# Patient Record
Sex: Female | Born: 1980 | Race: White | Hispanic: No | Smoking: Former smoker
Health system: Southern US, Community
[De-identification: ages and names within clinical notes are randomized; demographics above are authoritative.]

## PROBLEM LIST (undated history)

## (undated) ENCOUNTER — Emergency Department: Admission: EM | Payer: Commercial Managed Care - PPO | Source: Home / Self Care

## (undated) DIAGNOSIS — M1990 Unspecified osteoarthritis, unspecified site: Secondary | ICD-10-CM

## (undated) DIAGNOSIS — E079 Disorder of thyroid, unspecified: Secondary | ICD-10-CM

## (undated) DIAGNOSIS — E119 Type 2 diabetes mellitus without complications: Secondary | ICD-10-CM

## (undated) DIAGNOSIS — R51 Headache: Secondary | ICD-10-CM

## (undated) DIAGNOSIS — Q211 Atrial septal defect: Secondary | ICD-10-CM

## (undated) DIAGNOSIS — D649 Anemia, unspecified: Secondary | ICD-10-CM

## (undated) DIAGNOSIS — C801 Malignant (primary) neoplasm, unspecified: Secondary | ICD-10-CM

## (undated) DIAGNOSIS — K219 Gastro-esophageal reflux disease without esophagitis: Secondary | ICD-10-CM

## (undated) DIAGNOSIS — Q2112 Patent foramen ovale: Secondary | ICD-10-CM

## (undated) DIAGNOSIS — F419 Anxiety disorder, unspecified: Secondary | ICD-10-CM

## (undated) DIAGNOSIS — J45909 Unspecified asthma, uncomplicated: Secondary | ICD-10-CM

## (undated) DIAGNOSIS — I739 Peripheral vascular disease, unspecified: Secondary | ICD-10-CM

## (undated) DIAGNOSIS — R519 Headache, unspecified: Secondary | ICD-10-CM

## (undated) HISTORY — PX: KNEE SURGERY: SHX244

## (undated) HISTORY — PX: CHOLECYSTECTOMY: SHX55

## (undated) HISTORY — PX: SEPTOPLASTY: SUR1290

## (undated) HISTORY — PX: ABDOMINAL HYSTERECTOMY: SHX81

---
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
## 2005-07-09 ENCOUNTER — Emergency Department: Payer: Self-pay | Admitting: General Practice

## 2005-09-30 ENCOUNTER — Emergency Department: Payer: Self-pay | Admitting: Emergency Medicine

## 2006-03-28 ENCOUNTER — Emergency Department: Payer: Self-pay | Admitting: Emergency Medicine

## 2008-08-02 ENCOUNTER — Emergency Department: Payer: Self-pay | Admitting: Unknown Physician Specialty

## 2008-08-22 ENCOUNTER — Ambulatory Visit: Payer: Self-pay | Admitting: Obstetrics and Gynecology

## 2008-08-23 ENCOUNTER — Ambulatory Visit: Payer: Self-pay | Admitting: Obstetrics and Gynecology

## 2009-01-11 ENCOUNTER — Emergency Department: Payer: Self-pay | Admitting: Emergency Medicine

## 2010-09-12 ENCOUNTER — Ambulatory Visit: Payer: Self-pay | Admitting: Otolaryngology

## 2011-06-25 DIAGNOSIS — J45909 Unspecified asthma, uncomplicated: Secondary | ICD-10-CM | POA: Insufficient documentation

## 2011-07-08 ENCOUNTER — Emergency Department: Payer: Self-pay | Admitting: Emergency Medicine

## 2011-09-17 DIAGNOSIS — F419 Anxiety disorder, unspecified: Secondary | ICD-10-CM | POA: Insufficient documentation

## 2011-10-16 DIAGNOSIS — R609 Edema, unspecified: Secondary | ICD-10-CM | POA: Insufficient documentation

## 2012-06-19 ENCOUNTER — Emergency Department: Payer: Self-pay | Admitting: Emergency Medicine

## 2012-09-30 DIAGNOSIS — L920 Granuloma annulare: Secondary | ICD-10-CM | POA: Insufficient documentation

## 2012-12-13 ENCOUNTER — Emergency Department: Payer: Self-pay | Admitting: Emergency Medicine

## 2013-03-08 DIAGNOSIS — R7689 Other specified abnormal immunological findings in serum: Secondary | ICD-10-CM | POA: Insufficient documentation

## 2013-03-08 DIAGNOSIS — R768 Other specified abnormal immunological findings in serum: Secondary | ICD-10-CM | POA: Insufficient documentation

## 2013-03-16 ENCOUNTER — Ambulatory Visit: Payer: Self-pay | Admitting: Family Medicine

## 2013-06-30 DIAGNOSIS — M25562 Pain in left knee: Secondary | ICD-10-CM | POA: Insufficient documentation

## 2013-07-07 DIAGNOSIS — E8889 Other specified metabolic disorders: Secondary | ICD-10-CM | POA: Insufficient documentation

## 2013-09-19 DIAGNOSIS — G4700 Insomnia, unspecified: Secondary | ICD-10-CM | POA: Insufficient documentation

## 2013-10-13 ENCOUNTER — Ambulatory Visit: Payer: Self-pay

## 2013-12-09 DIAGNOSIS — R55 Syncope and collapse: Secondary | ICD-10-CM | POA: Insufficient documentation

## 2014-02-23 DIAGNOSIS — E079 Disorder of thyroid, unspecified: Secondary | ICD-10-CM | POA: Insufficient documentation

## 2014-05-30 DIAGNOSIS — I83893 Varicose veins of bilateral lower extremities with other complications: Secondary | ICD-10-CM | POA: Insufficient documentation

## 2014-12-25 ENCOUNTER — Ambulatory Visit
Admit: 2014-12-25 | Disposition: A | Discharge status: Home / Self Care | Payer: Self-pay | Attending: Family Medicine | Admitting: Family Medicine

## 2015-07-09 ENCOUNTER — Encounter: Payer: Self-pay | Admitting: Emergency Medicine

## 2015-07-09 ENCOUNTER — Ambulatory Visit
Admission: EM | Admit: 2015-07-09 | Discharge: 2015-07-09 | Disposition: A | Discharge status: Home / Self Care | Payer: Managed Care, Other (non HMO) | Attending: Family Medicine | Admitting: Family Medicine

## 2015-07-09 DIAGNOSIS — J0100 Acute maxillary sinusitis, unspecified: Secondary | ICD-10-CM

## 2015-07-09 DIAGNOSIS — J0110 Acute frontal sinusitis, unspecified: Secondary | ICD-10-CM

## 2015-07-09 HISTORY — DX: Disorder of thyroid, unspecified: E07.9

## 2015-07-09 HISTORY — DX: Type 2 diabetes mellitus without complications: E11.9

## 2015-07-09 MED ORDER — IBUPROFEN 800 MG PO TABS: 800.0000 mg | ORAL_TABLET | Freq: Three times a day (TID) | ORAL | Status: DC | PRN

## 2015-07-09 MED ORDER — AMOXICILLIN-POT CLAVULANATE 875-125 MG PO TABS: 1.0000 | ORAL_TABLET | Freq: Two times a day (BID) | ORAL | Status: DC

## 2015-07-09 NOTE — ED Notes (Signed)
Paitent c/o sinus pressure, stuffy nose, HAs, and cough for couple of days.

## 2015-07-09 NOTE — ED Provider Notes (Signed)
[facility] Emergency Department Provider Note  ____________________________________________  Time seen: Approximately 8:20 AM  I have reviewed the triage vital signs and the nursing notes.   HISTORY  Chief Complaint Cough and Facial Pain    HPI Sandra Sanders is a 34 y.o. female presents for the complaint of one week of runny nose, sinus congestion, sinus pressure and intermittent cough. Patient reports now with persistent sinus pressure and congestion. States current sinus discomfort is 5 out of 10 aching at the front of her face in the front of the forehead. Patient reports a few days ago felt like her face was swollen over her sinuses. Patient reports that her son was sick just prior to her. Patient reports cough is mostly at night and can feel drainage in the back of her throat. Denies fever. Reports continues to eat and drink well. Reports now getting very thick greenish discharge from nose. States has been taking over-the-counter cough and congestion medicine without relief.  Denies chest pain, shortness of breath, abdominal pain, rash, vision changes, dizziness or fever.   Past Medical History  Diagnosis Date  . Thyroid disease   . Diabetes mellitus without complication ([place])     There are no active problems to display for this patient.   Past Surgical History  Procedure Laterality Date  . Abdominal hysterectomy    . Cholecystectomy    . Knee surgery Left     Current Outpatient Rx  Name  Route  Sig  Dispense  Refill  . furosemide (LASIX) 40 MG tablet   Oral   Take 40 mg by mouth 2 (two) times daily.         . metFORMIN (GLUCOPHAGE) 500 MG tablet   Oral   Take 500 mg by mouth 2 (two) times daily with a meal.         . thyroid (ARMOUR) 60 MG tablet   Oral   Take 60 mg by mouth 2 (two) times daily.           Allergies Dilaudid  History reviewed. No pertinent family history.  Social History Social History  Substance  Use Topics  . Smoking status: Former Research scientist (life sciences)  . Smokeless tobacco: None  . Alcohol Use: No    Review of Systems Constitutional: No fever/chills Eyes: No visual changes. ENT: Positive runny nose, congestion, sinus pressure and intermittent cough. Cardiovascular: Denies chest pain. Respiratory: Denies shortness of breath. Gastrointestinal: No abdominal pain.  No nausea, no vomiting.  No diarrhea.  No constipation. Genitourinary: Negative for dysuria. Musculoskeletal: Negative for back pain. Skin: Negative for rash. Neurological: Negative for headaches, focal weakness or numbness.  10-point ROS otherwise negative.  ____________________________________________   PHYSICAL EXAM:  VITAL SIGNS: ED Triage Vitals  Enc Vitals Group     BP 07/09/15 0758 105/61 mmHg     Pulse Rate 07/09/15 0758 66     Resp 07/09/15 0758 16     Temp 07/09/15 0758 98 F (36.7 C)     Temp Source 07/09/15 0758 Oral     SpO2 07/09/15 0758 99 %     Weight 07/09/15 0758 225 lb (102.059 kg)     Height 07/09/15 0758 5\' 9"  (1.753 m)     Head Cir --      Peak Flow --      Pain Score 07/09/15 0801 5     Pain Loc --      Pain Edu? --      Excl. in [place]? --  Constitutional: Alert and oriented. Well appearing and in no acute distress. Eyes: Conjunctivae are normal. PERRL. EOMI. Head: Atraumatic. Moderate tenderness to palpation bilateral maxillary and frontal sinuses. No swelling, no erythema.  Ears: no erythema, bilateral TM dullness.  Nose: Nasal congestion with greenish nasal discharge. Bilateral nasal turbinate edema and bogginess. Nares patent.  Mouth/Throat: Mucous membranes are moist.  Oropharynx non-erythematous. Neck: No stridor.  No cervical spine tenderness to palpation. Hematological/Lymphatic/Immunilogical: No cervical lymphadenopathy. Cardiovascular: Normal rate, regular rhythm. Grossly normal heart sounds.  Good peripheral circulation. Respiratory: Normal respiratory effort.  No retractions.  Lungs CTAB. Good air movement. Gastrointestinal: Soft and nontender. No distention. Normal Bowel sounds.No CVA tenderness. Musculoskeletal: No lower or upper extremity tenderness nor edema.  No joint effusions.  Neurologic:  Normal speech and language. No gross focal neurologic deficits are appreciated. No gait instability. Skin:  Skin is warm, dry and intact. No rash noted. Psychiatric: Mood and affect are normal. Speech and behavior are normal.  ____________________________________________   LABS (all labs ordered are listed, but only abnormal results are displayed)  Labs Reviewed - No data to display   INITIAL IMPRESSION / ASSESSMENT AND PLAN / ED COURSE  Pertinent labs & imaging results that were available during my care of the patient were reviewed by me and considered in my medical decision making (see chart for details).  Very well-appearing patient. No acute distress. Presents with 1 week of runny nose, sinus congestion and sinus pressure. Lungs clear throughout. Abdomen soft and nontender. Moist mucous membranes. Moderate tender to palpation maxillary and frontal sinuses as well as report of sinus pressure and discomfort with nasal congestion. Unrelieved with over-the-counter medications. Will treat sinusitis with oral Augmentin, when necessary ibuprofen and supportive measures including fluids and rest.Discussed follow up with Primary care physician this week. Discussed follow up and return parameters including no resolution or any worsening concerns. Patient verbalized understanding and agreed to plan.   ____________________________________________   FINAL CLINICAL IMPRESSION(S) / ED DIAGNOSES  Final diagnoses:  Acute maxillary sinusitis, recurrence not specified  Acute frontal sinusitis, recurrence not specified       Marylene Land, NP 07/09/15 9470  Marylene Land, NP 07/09/15 [phone]

## 2015-07-09 NOTE — Discharge Instructions (Signed)

## 2015-07-10 ENCOUNTER — Encounter: Payer: Self-pay | Admitting: Emergency Medicine

## 2015-07-10 ENCOUNTER — Ambulatory Visit: Payer: Managed Care, Other (non HMO)

## 2015-07-10 ENCOUNTER — Ambulatory Visit
Admission: EM | Admit: 2015-07-10 | Discharge: 2015-07-10 | Disposition: A | Discharge status: Home / Self Care | Payer: Managed Care, Other (non HMO) | Attending: Family Medicine | Admitting: Family Medicine

## 2015-07-10 DIAGNOSIS — M1250 Traumatic arthropathy, unspecified site: Secondary | ICD-10-CM | POA: Diagnosis not present

## 2015-07-10 MED ORDER — NAPROXEN 500 MG PO TABS: 500.0000 mg | ORAL_TABLET | Freq: Two times a day (BID) | ORAL | Status: DC

## 2015-07-10 NOTE — ED Provider Notes (Signed)
CSN: [id number]     Arrival date & time 07/10/15  0704 History   First MD Initiated Contact with Patient 07/10/15 [phone]     Chief Complaint  Patient presents with  . Hand Pain  . Hand Injury   (Consider location/radiation/quality/duration/timing/severity/associated sxs/prior Treatment) HPI   This a 34 year old female who actually was here yesterday and diagnosed with a sinus infection. This morning she gone downstairs to obtain her sons and hers antibiotic medications when she tripped and fell against the [place] in her kitchen striking her right dominant hand on the [employer] injuring her index MCP joint. She has it wrapped in an ice pack. She denies any numbness or tingling distally. He has very little range of motion  Past Medical History  Diagnosis Date  . Thyroid disease   . Diabetes mellitus without complication [facility])    Past Surgical History  Procedure Laterality Date  . Abdominal hysterectomy    . Cholecystectomy    . Knee surgery Left    History reviewed. No pertinent family history. Social History  Substance Use Topics  . Smoking status: Former Research scientist (life sciences)  . Smokeless tobacco: None  . Alcohol Use: No   OB History    No data available     Review of Systems  Constitutional: Negative for fever, chills and fatigue.  Musculoskeletal: Positive for arthralgias.  Skin: Negative for color change, pallor and wound.  Neurological: Positive for weakness. Negative for numbness.  All other systems reviewed and are negative.   Allergies  Dilaudid  Home Medications   Prior to Admission medications   Medication Sig Start Date End Date Taking? Authorizing Provider  amoxicillin-clavulanate (AUGMENTIN) 875-125 MG tablet Take 1 tablet by mouth every 12 (twelve) hours. 07/09/15   Marylene Land, NP  furosemide (LASIX) 40 MG tablet Take 40 mg by mouth 2 (two) times daily.    Historical Provider, MD  ibuprofen (ADVIL,MOTRIN) 800 MG tablet Take 1 tablet (800 mg total) by mouth  every 8 (eight) hours as needed for mild pain or moderate pain. 07/09/15   Marylene Land, NP  metFORMIN (GLUCOPHAGE) 500 MG tablet Take 500 mg by mouth 2 (two) times daily with a meal.    Historical Provider, MD  naproxen (NAPROSYN) 500 MG tablet Take 1 tablet (500 mg total) by mouth 2 (two) times daily with a meal. 07/10/15   Lorin Picket, PA-C  thyroid (ARMOUR) 60 MG tablet Take 60 mg by mouth 2 (two) times daily.    Historical Provider, MD   Meds Ordered and Administered this Visit  Medications - No data to display  BP 106/57 mmHg  Pulse 80  Temp(Src) 98.1 F (36.7 C) (Tympanic)  Resp 16  Ht 5\' 9"  (1.753 m)  Wt 225 lb (102.059 kg)  BMI 33.21 kg/m2  SpO2 99% No data found.   Physical Exam  Constitutional: She appears well-developed and well-nourished. No distress.  HENT:  Head: Normocephalic and atraumatic.  Eyes: Pupils are equal, round, and reactive to light.  Musculoskeletal:  Examination of the right hand is no swelling ecchymosis or erythema. No apparent skin breaks. Tenderness is localized over the index MCP joint. Patient has exquisite tenderness to even the lightest of touch. There is a limited range of motion which is very narrow range of both flexion and extension. Patient does not allow for an adequate examination to be performed. Distally there does not appear to be any hip esthesia to light touch. There is good capillary refill distally.  Skin: She  is not diaphoretic.  Nursing note and vitals reviewed.   ED Course  Procedures (including critical care time)  Labs Review Labs Reviewed - No data to display  Imaging Review Dg Hand Complete Right  07/10/2015  CLINICAL DATA:  Right hand injury.  Fall today EXAM: RIGHT HAND - COMPLETE 3+ VIEW COMPARISON:  None. FINDINGS: There is no evidence of fracture or dislocation. There is no evidence of arthropathy or other focal bone abnormality. Soft tissues are unremarkable. IMPRESSION: Negative. Electronically Signed    By: Franchot Gallo M.D.   On: 07/10/2015 07:43     Visual Acuity Review  Right Eye Distance:   Left Eye Distance:   Bilateral Distance:    Right Eye Near:   Left Eye Near:    Bilateral Near:         MDM   1. Arthritis, traumatic, primary    New Prescriptions   NAPROXEN (NAPROSYN) 500 MG TABLET    Take 1 tablet (500 mg total) by mouth 2 (two) times daily with a meal.  Plan: 1. Test/x-ray results and diagnosis reviewed with patient 2. rx as per orders; risks, benefits, potential side effects reviewed with patient 3. Recommend supportive treatment with ic/elevation. Use finger splint for activity for 2 weeks. Take Naprosyn with food. May dc splint for quiet activities. 4. F/u prn if symptoms worsen or don't improve     Lorin Picket, PA-C 07/10/15 [phone]

## 2015-07-10 NOTE — ED Notes (Signed)
Patient states that she tripped and fell and hit her right hand on the kitchen counter around 6:00am this morning.

## 2015-08-13 ENCOUNTER — Ambulatory Visit
Admission: EM | Admit: 2015-08-13 | Discharge: 2015-08-13 | Disposition: A | Discharge status: Home / Self Care | Payer: Managed Care, Other (non HMO) | Attending: Family Medicine | Admitting: Family Medicine

## 2015-08-13 ENCOUNTER — Encounter: Payer: Self-pay | Admitting: Emergency Medicine

## 2015-08-13 ENCOUNTER — Emergency Department (HOSPITAL_COMMUNITY): Admission: EM | Admit: 2015-08-13 | Payer: Self-pay | Source: Home / Self Care

## 2015-08-13 DIAGNOSIS — J029 Acute pharyngitis, unspecified: Secondary | ICD-10-CM | POA: Diagnosis not present

## 2015-08-13 DIAGNOSIS — J069 Acute upper respiratory infection, unspecified: Secondary | ICD-10-CM

## 2015-08-13 LAB — RAPID STREP SCREEN (MED CTR MEBANE ONLY): STREPTOCOCCUS, GROUP A SCREEN (DIRECT): NEGATIVE

## 2015-08-13 MED ORDER — PSEUDOEPH-BROMPHEN-DM 30-2-10 MG/5ML PO SYRP: 10.0000 mL | ORAL_SOLUTION | Freq: Four times a day (QID) | ORAL | Status: DC | PRN

## 2015-08-13 NOTE — ED Notes (Signed)
Patient c/o sinus pain and pressure, sore throat and nasal congestion since yesterday.

## 2015-08-13 NOTE — Discharge Instructions (Signed)
Take medication as prescribed. Rest. Drink plenty of fluids. Use over-the-counter throat lozenges as needed. Rest. Eat and drink well.  Follow-up with your primary care physician this week as needed. Return to urgent care as needed for new or worsening concerns.  Pharyngitis Pharyngitis is redness, pain, and swelling (inflammation) of your pharynx.  CAUSES  Pharyngitis is usually caused by infection. Most of the time, these infections are from viruses (viral) and are part of a cold. However, sometimes pharyngitis is caused by bacteria (bacterial). Pharyngitis can also be caused by allergies. Viral pharyngitis may be spread from person to person by coughing, sneezing, and personal items or utensils (cups, forks, spoons, toothbrushes). Bacterial pharyngitis may be spread from person to person by more intimate contact, such as kissing.  SIGNS AND SYMPTOMS  Symptoms of pharyngitis include:   Sore throat.   Tiredness (fatigue).   Low-grade fever.   Headache.  Joint pain and muscle aches.  Skin rashes.  Swollen lymph nodes.  Plaque-like film on throat or tonsils (often seen with bacterial pharyngitis). DIAGNOSIS  Your health care provider will ask you questions about your illness and your symptoms. Your medical history, along with a physical exam, is often all that is needed to diagnose pharyngitis. Sometimes, a rapid strep test is done. Other lab tests may also be done, depending on the suspected cause.  TREATMENT  Viral pharyngitis will usually get better in 3-4 days without the use of medicine. Bacterial pharyngitis is treated with medicines that kill germs (antibiotics).  HOME CARE INSTRUCTIONS   Drink enough water and fluids to keep your urine clear or pale yellow.   Only take over-the-counter or prescription medicines as directed by your health care provider:   If you are prescribed antibiotics, make sure you finish them even if you start to feel better.   Do not take  aspirin.   Get lots of rest.   Gargle with 8 oz of salt water ( tsp of salt per 1 qt of water) as often as every 1-2 hours to soothe your throat.   Throat lozenges (if you are not at risk for choking) or sprays may be used to soothe your throat. SEEK MEDICAL CARE IF:   You have large, tender lumps in your neck.  You have a rash.  You cough up green, yellow-brown, or bloody spit. SEEK IMMEDIATE MEDICAL CARE IF:   Your neck becomes stiff.  You drool or are unable to swallow liquids.  You vomit or are unable to keep medicines or liquids down.  You have severe pain that does not go away with the use of recommended medicines.  You have trouble breathing (not caused by a stuffy nose). MAKE SURE YOU:   Understand these instructions.  Will watch your condition.  Will get help right away if you are not doing well or get worse.   This information is not intended to replace advice given to you by your health care provider. Make sure you discuss any questions you have with your health care provider.   Document Released: 09/08/2005 Document Revised: 06/29/2013 Document Reviewed: 05/16/2013 Elsevier Interactive Patient Education 2016 [employer]  Upper Respiratory Infection, Adult Most upper respiratory infections (URIs) are a viral infection of the air passages leading to the lungs. A URI affects the nose, throat, and upper air passages. The most common type of URI is nasopharyngitis and is typically referred to as "the common cold." URIs run their course and usually go away on their own. Most of  the time, a URI does not require medical attention, but sometimes a bacterial infection in the upper airways can follow a viral infection. This is called a secondary infection. Sinus and middle ear infections are common types of secondary upper respiratory infections. Bacterial pneumonia can also complicate a URI. A URI can worsen asthma and chronic obstructive pulmonary disease (COPD).  Sometimes, these complications can require emergency medical care and may be life threatening.  CAUSES Almost all URIs are caused by viruses. A virus is a type of germ and can spread from one person to another.  RISKS FACTORS You may be at risk for a URI if:   You smoke.   You have chronic heart or lung disease.  You have a weakened defense (immune) system.   You are very young or very old.   You have nasal allergies or asthma.  You work in crowded or poorly ventilated areas.  You work in health care facilities or schools. SIGNS AND SYMPTOMS  Symptoms typically develop 2-3 days after you come in contact with a cold virus. Most viral URIs last 7-10 days. However, viral URIs from the influenza virus (flu virus) can last 14-18 days and are typically more severe. Symptoms may include:   Runny or stuffy (congested) nose.   Sneezing.   Cough.   Sore throat.   Headache.   Fatigue.   Fever.   Loss of appetite.   Pain in your forehead, behind your eyes, and over your cheekbones (sinus pain).  Muscle aches.  DIAGNOSIS  Your health care provider may diagnose a URI by:  Physical exam.  Tests to check that your symptoms are not due to another condition such as:  Strep throat.  Sinusitis.  Pneumonia.  Asthma. TREATMENT  A URI goes away on its own with time. It cannot be cured with medicines, but medicines may be prescribed or recommended to relieve symptoms. Medicines may help:  Reduce your fever.  Reduce your cough.  Relieve nasal congestion. HOME CARE INSTRUCTIONS   Take medicines only as directed by your health care provider.   Gargle warm saltwater or take cough drops to comfort your throat as directed by your health care provider.  Use a warm mist humidifier or inhale steam from a shower to increase air moisture. This may make it easier to breathe.  Drink enough fluid to keep your urine clear or pale yellow.   Eat soups and other clear  broths and maintain good nutrition.   Rest as needed.   Return to work when your temperature has returned to normal or as your health care provider advises. You may need to stay home longer to avoid infecting others. You can also use a face mask and careful hand washing to prevent spread of the virus.  Increase the usage of your inhaler if you have asthma.   Do not use any tobacco products, including cigarettes, chewing tobacco, or electronic cigarettes. If you need help quitting, ask your health care provider. PREVENTION  The best way to protect yourself from getting a cold is to practice good hygiene.   Avoid oral or hand contact with people with cold symptoms.   Wash your hands often if contact occurs.  There is no clear evidence that vitamin C, vitamin E, echinacea, or exercise reduces the chance of developing a cold. However, it is always recommended to get plenty of rest, exercise, and practice good nutrition.  SEEK MEDICAL CARE IF:   You are getting worse rather than better.  Your symptoms are not controlled by medicine.   You have chills.  You have worsening shortness of breath.  You have brown or red mucus.  You have yellow or brown nasal discharge.  You have pain in your face, especially when you bend forward.  You have a fever.  You have swollen neck glands.  You have pain while swallowing.  You have white areas in the back of your throat. SEEK IMMEDIATE MEDICAL CARE IF:   You have severe or persistent:  Headache.  Ear pain.  Sinus pain.  Chest pain.  You have chronic lung disease and any of the following:  Wheezing.  Prolonged cough.  Coughing up blood.  A change in your usual mucus.  You have a stiff neck.  You have changes in your:  Vision.  Hearing.  Thinking.  Mood. MAKE SURE YOU:   Understand these instructions.  Will watch your condition.  Will get help right away if you are not doing well or get worse.   This  information is not intended to replace advice given to you by your health care provider. Make sure you discuss any questions you have with your health care provider.   Document Released: 03/04/2001 Document Revised: 01/23/2015 Document Reviewed: 12/14/2013 Elsevier Interactive Patient Education [employer].

## 2015-08-13 NOTE — ED Provider Notes (Signed)
[facility]  ____________________________________________  Time seen: Approximately 8:34 AM  I have reviewed the triage vital signs and the nursing notes.   HISTORY  Chief Complaint Sore Throat; Recurrent Sinusitis; and Nasal Congestion   HPI Kimothy Croke is a 34 y.o. female presents for complaints of sinus drainage, nasal congestion, sore throat and bilateral ear discomfort since yesterday. Patient reports she continues to drink fluids well but hasn't had much of an appetite today. Patient does reports that she has a sinus infection approximately one month ago. Patient states that one month ago the sickness fully resolved after oral antibiotics. Denies other recent sickness. Denies known sick contacts. Denies others in household sick.  States current sore throat pain is 6 out of 10 aching and hurts to swallow. Denies pain radiation. Denies chest pain, shortness of breath, abdominal pain, leg swelling, headache, dizziness. Reports that she did have a fever at home and states that the temperature was "99 orally ". States that she had a call to work today.   Past Medical History  Diagnosis Date  . Thyroid disease   . Diabetes mellitus without complication ([place])     There are no active problems to display for this patient.   Past Surgical History  Procedure Laterality Date  . Abdominal hysterectomy    . Cholecystectomy    . Knee surgery Left     Current Outpatient Rx  Name  Route  Sig  Dispense  Refill  .           . furosemide (LASIX) 40 MG tablet   Oral   Take 40 mg by mouth 2 (two) times daily.         .           . metFORMIN (GLUCOPHAGE) 500 MG tablet   Oral   Take 500 mg by mouth 2 (two) times daily with a meal.         . naproxen (NAPROSYN) 500 MG tablet   Oral   Take 1 tablet (500 mg total) by mouth 2 (two) times daily with a meal.   60 tablet   0   . thyroid (ARMOUR) 60 MG tablet   Oral   Take 60 mg by mouth 2 (two) times daily.            Allergies Dilaudid  History reviewed. No pertinent family history.  Social History Social History  Substance Use Topics  . Smoking status: Former Research scientist (life sciences)  . Smokeless tobacco: None  . Alcohol Use: No    Review of Systems Constitutional: Positive subjective fever at home yesterday. Eyes: No visual changes. ENT: Positive runny nose, nasal congestion, earache and sore throat. Cardiovascular: Denies chest pain. Respiratory: Denies shortness of breath. Gastrointestinal: No abdominal pain.  No nausea, no vomiting.  No diarrhea.  No constipation. Genitourinary: Negative for dysuria. Musculoskeletal: Negative for back pain. Skin: Negative for rash. Neurological: Negative for headaches, focal weakness or numbness.  10-point ROS otherwise negative.  ____________________________________________   PHYSICAL EXAM:  VITAL SIGNS: ED Triage Vitals  Enc Vitals Group     BP 08/13/15 0824 103/63 mmHg     Pulse Rate 08/13/15 0824 77     Resp 08/13/15 0824 16     Temp 08/13/15 0824 97.6 F (36.4 C)     Temp Source 08/13/15 0824 Tympanic     SpO2 08/13/15 0824 100 %     Weight 08/13/15 0824 225 lb (102.059 kg)     Height 08/13/15 0824  5\' 9"  (1.753 m)     Head Cir --      Peak Flow --      Pain Score 08/13/15 0827 8     Pain Loc --      Pain Edu? --      Excl. in [place]? --     Constitutional: Alert and oriented. Well appearing and in no acute distress. Eyes: Conjunctivae are normal. PERRL. EOMI. Head: Atraumatic. Minimal tenderness to palpation bilateral maxillary sinuses. No frontal sinus tenderness to palpation. No swelling or erythema.  Ears: no erythema, normal TMs bilaterally.   Nose: Mild clear rhinorrhea. No nasal turbinate erythema or inflammation. Nares patent.  Mouth/Throat: Mucous membranes are moist.  Mild pharyngeal erythema. No tonsillar swelling or exudate. No uvular shift or deviation. Tolerating oral secretions well in room. Neck: No stridor.  No cervical spine  tenderness to palpation. Hematological/Lymphatic/Immunilogical: No cervical lymphadenopathy. Cardiovascular: Normal rate, regular rhythm. Grossly normal heart sounds.  Good peripheral circulation. Respiratory: Normal respiratory effort.  No retractions. Lungs CTAB. No wheezes, rales or rhonchi. Good air movement. Gastrointestinal: Soft and nontender. No distention. Normal Bowel sounds. Musculoskeletal: No lower or upper extremity tenderness nor edema.  No joint effusions. Bilateral pedal pulses equal and easily palpated.  Neurologic:  Normal speech and language. No gross focal neurologic deficits are appreciated. No gait instability. Skin:  Skin is warm, dry and intact. No rash noted. Psychiatric: Mood and affect are normal. Speech and behavior are normal.  ____________________________________________   LABS (all labs ordered are listed, but only abnormal results are displayed)  Labs Reviewed  RAPID STREP SCREEN (NOT AT [facility])  CULTURE, GROUP A STREP ([place])    INITIAL IMPRESSION / ASSESSMENT AND PLAN / ED COURSE  Pertinent labs & imaging results that were available during my care of the patient were reviewed by me and considered in my medical decision making (see chart for details).  Very well-appearing patient. No acute distress. Presents for the complaints of sore throat, nasal congestion, runny nose and bilateral ear discomfort since yesterday. Lungs clear throughout. Abdomen soft and nontender. Moist mucous membranes. Tolerating oral secretions well. Strep negative. Will culture strep swab. Suspect viral pharyngitis and viral upper respiratory infection. Will treat supportively with symptomatically. When necessary [place]. Work note given.  Discussed follow up with Primary care physician this week. Discussed follow up and return parameters including no resolution or any worsening concerns. Patient verbalized understanding and agreed to plan.    ____________________________________________   FINAL CLINICAL IMPRESSION(S) / ED DIAGNOSES  Final diagnoses:  Pharyngitis  URI (upper respiratory infection)       Marylene Land, NP 08/13/15 [phone]

## 2015-08-15 LAB — CULTURE, GROUP A STREP (THRC)

## 2015-12-18 ENCOUNTER — Other Ambulatory Visit: Payer: Self-pay | Admitting: Specialist

## 2015-12-18 DIAGNOSIS — R0683 Snoring: Secondary | ICD-10-CM | POA: Insufficient documentation

## 2015-12-18 DIAGNOSIS — R5381 Other malaise: Secondary | ICD-10-CM | POA: Insufficient documentation

## 2015-12-18 DIAGNOSIS — R5383 Other fatigue: Secondary | ICD-10-CM | POA: Insufficient documentation

## 2015-12-19 ENCOUNTER — Ambulatory Visit
Admission: RE | Admit: 2015-12-19 | Discharge: 2015-12-19 | Disposition: A | Discharge status: Home / Self Care | Payer: Managed Care, Other (non HMO) | Source: Ambulatory Visit | Attending: Specialist | Admitting: Specialist

## 2016-01-18 DIAGNOSIS — E559 Vitamin D deficiency, unspecified: Secondary | ICD-10-CM | POA: Insufficient documentation

## 2016-01-18 DIAGNOSIS — A048 Other specified bacterial intestinal infections: Secondary | ICD-10-CM | POA: Insufficient documentation

## 2016-03-18 DIAGNOSIS — E611 Iron deficiency: Secondary | ICD-10-CM | POA: Insufficient documentation

## 2016-04-08 ENCOUNTER — Encounter
Admission: RE | Admit: 2016-04-08 | Discharge: 2016-04-08 | Disposition: A | Discharge status: Home / Self Care | Payer: Managed Care, Other (non HMO) | Source: Ambulatory Visit | Attending: Specialist | Admitting: Specialist

## 2016-04-08 DIAGNOSIS — Z01812 Encounter for preprocedural laboratory examination: Secondary | ICD-10-CM | POA: Insufficient documentation

## 2016-04-08 HISTORY — DX: Malignant (primary) neoplasm, unspecified: C80.1

## 2016-04-08 HISTORY — DX: Unspecified asthma, uncomplicated: J45.909

## 2016-04-08 HISTORY — DX: Anemia, unspecified: D64.9

## 2016-04-08 HISTORY — DX: Unspecified osteoarthritis, unspecified site: M19.90

## 2016-04-08 HISTORY — DX: Headache: R51

## 2016-04-08 HISTORY — DX: Gastro-esophageal reflux disease without esophagitis: K21.9

## 2016-04-08 HISTORY — DX: Anxiety disorder, unspecified: F41.9

## 2016-04-08 HISTORY — DX: Patent foramen ovale: Q21.12

## 2016-04-08 HISTORY — DX: Headache, unspecified: R51.9

## 2016-04-08 HISTORY — DX: Atrial septal defect: Q21.1

## 2016-04-08 HISTORY — DX: Peripheral vascular disease, unspecified: I73.9

## 2016-04-08 LAB — SURGICAL PCR SCREEN
MRSA, PCR: NEGATIVE
Staphylococcus aureus: NEGATIVE

## 2016-04-08 LAB — PROTIME-INR
INR: 1.03
PROTHROMBIN TIME: 13.7 s (ref 11.4–15.0)

## 2016-04-08 LAB — DIFFERENTIAL
Basophils Absolute: 0 10*3/uL (ref 0–0.1)
Basophils Relative: 1 %
EOS ABS: 0.1 10*3/uL (ref 0–0.7)
EOS PCT: 2 %
Lymphocytes Relative: 25 %
Lymphs Abs: 1.7 10*3/uL (ref 1.0–3.6)
Monocytes Absolute: 0.3 10*3/uL (ref 0.2–0.9)
Monocytes Relative: 5 %
NEUTROS PCT: 67 %
Neutro Abs: 4.6 10*3/uL (ref 1.4–6.5)

## 2016-04-08 LAB — TYPE AND SCREEN
ABO/RH(D): A NEG
ANTIBODY SCREEN: NEGATIVE

## 2016-04-08 LAB — CBC
HCT: 38 % (ref 35.0–47.0)
Hemoglobin: 12.9 g/dL (ref 12.0–16.0)
MCH: 27.2 pg (ref 26.0–34.0)
MCHC: 34 g/dL (ref 32.0–36.0)
MCV: 80 fL (ref 80.0–100.0)
PLATELETS: 253 10*3/uL (ref 150–440)
RBC: 4.75 MIL/uL (ref 3.80–5.20)
RDW: 14.3 % (ref 11.5–14.5)
WBC: 6.8 10*3/uL (ref 3.6–11.0)

## 2016-04-08 LAB — BASIC METABOLIC PANEL
Anion gap: 7 (ref 5–15)
BUN: 11 mg/dL (ref 6–20)
CALCIUM: 9.2 mg/dL (ref 8.9–10.3)
CO2: 27 mmol/L (ref 22–32)
CREATININE: 0.67 mg/dL (ref 0.44–1.00)
Chloride: 104 mmol/L (ref 101–111)
GFR calc non Af Amer: >60 mL/min (ref >60)
Glucose, Bld: 126 mg/dL — ABNORMAL HIGH (ref 65–99)
Potassium: 3 mmol/L — ABNORMAL LOW (ref 3.5–5.1)
SODIUM: 138 mmol/L (ref 135–145)

## 2016-04-08 LAB — APTT: aPTT: 30 seconds (ref 24–36)

## 2016-04-08 NOTE — Pre-Procedure Instructions (Signed)
Potassium results (3.0)  faxed and called to Dr. Darnell Level office (spoke to [place])

## 2016-04-08 NOTE — Patient Instructions (Signed)
  Your procedure is scheduled [id number] 1, 2017 (Tuesday) Report to Same Day Surgery 2nd floor Medical Mall To find out your arrival time please call [phone] between 1PM - 3PM on April 21, 2016 (Monday) Remember: Instructions that are not followed completely may result in serious medical risk, up to and including death, or upon the discretion of your surgeon and anesthesiologist your surgery may need to be rescheduled.    _x___ 1. Do not eat food or drink liquids after midnight. No gum chewing or hard candies.     _x___ 2. No Alcohol for 24 hours before or after surgery.   _x__3. No Smoking for 24 prior to surgery.   ____  4. Bring all medications with you on the day of surgery if instructed.    __x__ 5. Notify your doctor if there is any change in your medical condition     (cold, fever, infections).     Do not wear jewelry, make-up, hairpins, clips or nail polish.  Do not wear lotions, powders, or perfumes. You may wear deodorant.  Do not shave 48 hours prior to surgery. Men may shave face and neck.  Do not bring valuables to the hospital.    [facility] is not responsible for any belongings or valuables.               Contacts, dentures or bridgework may not be worn into surgery.  Leave your suitcase in the car. After surgery it may be brought to your room.  For patients admitted to the hospital, discharge time is determined by your treatment team.   Patients discharged the day of surgery will not be allowed to drive home.    Please read over the following fact sheets that you were given:   [facility] Preparing for Surgery and or MRSA Information   _x___ Take these medicines the morning of surgery with A SIP OF WATER:    1.  Armour thyroid  2.  3.  4.  5.  6.  ____ Fleet Enema (as directed)   _x___ Use CHG Soap or sage wipes as directed on instruction sheet   _x___ Use inhalers on the day of surgery and bring to hospital day of surgery (Use Albuterol inhaler the  morning of surgery and bring to hospital)   _x___ Stop metformin 2 days prior to surgery (Stop Metformin on July 30)    ____ Take 1/2 of usual insulin dose the night before surgery and none on the morning of surgery           __x__ Stop aspirin or coumadin, or plavix (N/A)  _x__ Stop Anti-inflammatories such as Advil, Aleve, Ibuprofen, Motrin, Naproxen,          Naprosyn, Goodies powders or aspirin products. (Stop Aleve ten days prior to surgery) Ok to take Tylenol.   ____ Stop supplements until after surgery.    ____ Bring C-Pap to the hospital.

## 2016-04-09 ENCOUNTER — Encounter: Payer: Self-pay | Admitting: *Deleted

## 2016-04-22 ENCOUNTER — Inpatient Hospital Stay: Payer: Managed Care, Other (non HMO) | Admitting: Certified Registered Nurse Anesthetist

## 2016-04-22 ENCOUNTER — Encounter
Admission: RE | Disposition: A | Discharge status: Home / Self Care | Payer: Self-pay | Source: Ambulatory Visit | Attending: Specialist

## 2016-04-22 ENCOUNTER — Inpatient Hospital Stay
Admission: RE | Admit: 2016-04-22 | Discharge: 2016-04-24 | DRG: 621 | Disposition: A | Discharge status: Home / Self Care | Payer: Managed Care, Other (non HMO) | Source: Ambulatory Visit | Attending: Specialist | Admitting: Specialist

## 2016-04-22 ENCOUNTER — Encounter: Payer: Self-pay | Admitting: Certified Registered Nurse Anesthetist

## 2016-04-22 DIAGNOSIS — Z833 Family history of diabetes mellitus: Secondary | ICD-10-CM

## 2016-04-22 DIAGNOSIS — Z6835 Body mass index (BMI) 35.0-35.9, adult: Secondary | ICD-10-CM | POA: Diagnosis not present

## 2016-04-22 DIAGNOSIS — Z79899 Other long term (current) drug therapy: Secondary | ICD-10-CM | POA: Diagnosis not present

## 2016-04-22 DIAGNOSIS — Z8261 Family history of arthritis: Secondary | ICD-10-CM

## 2016-04-22 DIAGNOSIS — Z87891 Personal history of nicotine dependence: Secondary | ICD-10-CM

## 2016-04-22 DIAGNOSIS — Z7984 Long term (current) use of oral hypoglycemic drugs: Secondary | ICD-10-CM | POA: Diagnosis not present

## 2016-04-22 DIAGNOSIS — Z8249 Family history of ischemic heart disease and other diseases of the circulatory system: Secondary | ICD-10-CM | POA: Diagnosis not present

## 2016-04-22 DIAGNOSIS — E559 Vitamin D deficiency, unspecified: Secondary | ICD-10-CM | POA: Diagnosis present

## 2016-04-22 HISTORY — PX: LAPAROSCOPIC GASTRIC SLEEVE RESECTION: SHX5895

## 2016-04-22 LAB — POCT I-STAT 4, (NA,K, GLUC, HGB,HCT)
GLUCOSE: 88 mg/dL (ref 65–99)
HEMATOCRIT: 39 % (ref 36.0–46.0)
Hemoglobin: 13.3 g/dL (ref 12.0–15.0)
Potassium: 3.7 mmol/L (ref 3.5–5.1)
Sodium: 140 mmol/L (ref 135–145)

## 2016-04-22 LAB — GLUCOSE, CAPILLARY
Glucose-Capillary: 77 mg/dL (ref 65–99)
Glucose-Capillary: 93 mg/dL (ref 65–99)

## 2016-04-22 SURGERY — GASTRECTOMY, SLEEVE, LAPAROSCOPIC
Anesthesia: General | Wound class: Clean Contaminated

## 2016-04-22 MED ORDER — LIDOCAINE-EPINEPHRINE (PF) 1 %-1:200000 IJ SOLN
INTRAMUSCULAR | Status: AC
  Filled 2016-04-22: qty 30

## 2016-04-22 MED ORDER — SUGAMMADEX SODIUM 200 MG/2ML IV SOLN
INTRAVENOUS | Status: DC | PRN
  Administered 2016-04-22: 225 mg via INTRAVENOUS

## 2016-04-22 MED ORDER — MIDAZOLAM HCL 5 MG/5ML IJ SOLN
INTRAMUSCULAR | Status: AC
  Administered 2016-04-22: 1 mg via INTRAVENOUS
  Filled 2016-04-22: qty 5

## 2016-04-22 MED ORDER — BUPIVACAINE-EPINEPHRINE (PF) 0.5% -1:200000 IJ SOLN
INTRAMUSCULAR | Status: DC | PRN
  Administered 2016-04-22: 15 mL via PERINEURAL

## 2016-04-22 MED ORDER — SCOPOLAMINE 1 MG/3DAYS TD PT72
MEDICATED_PATCH | TRANSDERMAL | Status: AC
  Administered 2016-04-22: 1.5 mg via TRANSDERMAL
  Filled 2016-04-22: qty 1

## 2016-04-22 MED ORDER — BUPIVACAINE-EPINEPHRINE (PF) 0.5% -1:200000 IJ SOLN
INTRAMUSCULAR | Status: AC
  Filled 2016-04-22: qty 30

## 2016-04-22 MED ORDER — SODIUM CHLORIDE 0.9 % IV SOLN
INTRAVENOUS | Status: DC
  Administered 2016-04-22: 10:00:00 via INTRAVENOUS

## 2016-04-22 MED ORDER — ONDANSETRON HCL 4 MG/2ML IJ SOLN
INTRAMUSCULAR | Status: AC
  Administered 2016-04-22: 4 mg via INTRAVENOUS
  Filled 2016-04-22: qty 2

## 2016-04-22 MED ORDER — LIDOCAINE HCL (CARDIAC) 20 MG/ML IV SOLN
INTRAVENOUS | Status: DC | PRN
  Administered 2016-04-22: 60 mg via INTRAVENOUS

## 2016-04-22 MED ORDER — OXYCODONE HCL 5 MG/5ML PO SOLN: 5.0000 mg | ORAL | Status: DC | PRN

## 2016-04-22 MED ORDER — SODIUM CHLORIDE 0.9 % IV SOLN
INTRAVENOUS | Status: DC
  Administered 2016-04-22 – 2016-04-24 (×2): via INTRAVENOUS

## 2016-04-22 MED ORDER — DEXTROSE 5 % IV SOLN
3.0000 g | Freq: Once | INTRAVENOUS | Status: DC
  Filled 2016-04-22: qty 3

## 2016-04-22 MED ORDER — MIDAZOLAM HCL 2 MG/2ML IJ SOLN
INTRAMUSCULAR | Status: DC | PRN
  Administered 2016-04-22: 2 mg via INTRAVENOUS

## 2016-04-22 MED ORDER — LIDOCAINE-EPINEPHRINE (PF) 1 %-1:200000 IJ SOLN
INTRAMUSCULAR | Status: DC | PRN
  Administered 2016-04-22: 10 mL

## 2016-04-22 MED ORDER — FENTANYL CITRATE (PF) 100 MCG/2ML IJ SOLN
INTRAMUSCULAR | Status: AC
  Administered 2016-04-22: 25 ug via INTRAVENOUS
  Filled 2016-04-22: qty 2

## 2016-04-22 MED ORDER — PREMIER PROTEIN SHAKE
2.0000 [oz_av] | ORAL | Status: DC
  Administered 2016-04-24 (×6): 2 [oz_av] via ORAL

## 2016-04-22 MED ORDER — DEXAMETHASONE SODIUM PHOSPHATE 10 MG/ML IJ SOLN
INTRAMUSCULAR | Status: DC | PRN
  Administered 2016-04-22: 5 mg via INTRAVENOUS

## 2016-04-22 MED ORDER — ROCURONIUM BROMIDE 100 MG/10ML IV SOLN
INTRAVENOUS | Status: DC | PRN
  Administered 2016-04-22: 50 mg via INTRAVENOUS

## 2016-04-22 MED ORDER — FENTANYL CITRATE (PF) 100 MCG/2ML IJ SOLN
INTRAMUSCULAR | Status: DC | PRN
  Administered 2016-04-22: 50 ug via INTRAVENOUS
  Administered 2016-04-22: 100 ug via INTRAVENOUS
  Administered 2016-04-22 (×2): 50 ug via INTRAVENOUS

## 2016-04-22 MED ORDER — ENOXAPARIN SODIUM 40 MG/0.4ML ~~LOC~~ SOLN
40.0000 mg | SUBCUTANEOUS | Status: DC
Start: 2016-04-23 — End: 2016-04-24
  Administered 2016-04-23 – 2016-04-24 (×2): 40 mg via SUBCUTANEOUS
  Filled 2016-04-22 (×2): qty 0.4

## 2016-04-22 MED ORDER — DEXTROSE 5 % IV SOLN
INTRAVENOUS | Status: DC | PRN
  Administered 2016-04-22: 3 g via INTRAVENOUS

## 2016-04-22 MED ORDER — MORPHINE SULFATE (PF) 2 MG/ML IV SOLN
2.0000 mg | INTRAVENOUS | Status: DC | PRN
  Administered 2016-04-22: 4 mg via INTRAVENOUS
  Administered 2016-04-22: 2 mg via INTRAVENOUS
  Administered 2016-04-22: 4 mg via INTRAVENOUS
  Administered 2016-04-23: 2 mg via INTRAVENOUS
  Administered 2016-04-23: 4 mg via INTRAVENOUS
  Administered 2016-04-23: 2 mg via INTRAVENOUS
  Filled 2016-04-22 (×2): qty 1
  Filled 2016-04-22 (×2): qty 2
  Filled 2016-04-22: qty 1
  Filled 2016-04-22: qty 2

## 2016-04-22 MED ORDER — ACETAMINOPHEN 10 MG/ML IV SOLN
INTRAVENOUS | Status: AC
  Filled 2016-04-22: qty 100

## 2016-04-22 MED ORDER — ONDANSETRON HCL 4 MG/2ML IJ SOLN
INTRAMUSCULAR | Status: DC | PRN
  Administered 2016-04-22: 4 mg via INTRAVENOUS

## 2016-04-22 MED ORDER — PROPOFOL 10 MG/ML IV BOLUS
INTRAVENOUS | Status: DC | PRN
  Administered 2016-04-22: 160 mg via INTRAVENOUS

## 2016-04-22 MED ORDER — LACTATED RINGERS IV SOLN
INTRAVENOUS | Status: DC | PRN
  Administered 2016-04-22: 14:00:00 via INTRAVENOUS

## 2016-04-22 MED ORDER — FENTANYL CITRATE (PF) 100 MCG/2ML IJ SOLN
25.0000 ug | INTRAMUSCULAR | Status: DC | PRN
  Administered 2016-04-22 (×4): 25 ug via INTRAVENOUS

## 2016-04-22 MED ORDER — ACETAMINOPHEN 160 MG/5ML PO SOLN: 650.0000 mg | ORAL | Status: DC | PRN

## 2016-04-22 MED ORDER — ONDANSETRON HCL 4 MG/2ML IJ SOLN
4.0000 mg | Freq: Once | INTRAMUSCULAR | Status: AC | PRN
  Administered 2016-04-22: 4 mg via INTRAVENOUS

## 2016-04-22 MED ORDER — ACETAMINOPHEN 160 MG/5ML PO SOLN: 325.0000 mg | ORAL | Status: DC | PRN

## 2016-04-22 MED ORDER — SCOPOLAMINE 1 MG/3DAYS TD PT72
1.0000 | MEDICATED_PATCH | TRANSDERMAL | Status: DC
  Administered 2016-04-22: 1.5 mg via TRANSDERMAL

## 2016-04-22 MED ORDER — MORPHINE SULFATE (PF) 2 MG/ML IV SOLN: 2.0000 mg | INTRAVENOUS | Status: DC | PRN

## 2016-04-22 MED ORDER — MIDAZOLAM HCL 5 MG/5ML IJ SOLN
1.0000 mg | INTRAMUSCULAR | Status: AC | PRN
  Administered 2016-04-22 (×2): 1 mg via INTRAVENOUS

## 2016-04-22 MED ORDER — ONDANSETRON HCL 4 MG/2ML IJ SOLN
4.0000 mg | INTRAMUSCULAR | Status: DC | PRN
  Administered 2016-04-22 – 2016-04-23 (×5): 4 mg via INTRAVENOUS
  Filled 2016-04-22 (×5): qty 2

## 2016-04-22 MED ORDER — ACETAMINOPHEN 10 MG/ML IV SOLN
1000.0000 mg | Freq: Once | INTRAVENOUS | Status: AC
  Administered 2016-04-22: 1000 mg via INTRAVENOUS

## 2016-04-22 SURGICAL SUPPLY — 45 items
APPLIER CLIP ROT 13.4 12 LRG (CLIP)
BANDAGE ELASTIC 6 LF NS (GAUZE/BANDAGES/DRESSINGS) ×6 IMPLANT
BLADE SURG SZ11 CARB STEEL (BLADE) ×3 IMPLANT
CANISTER SUCT 1200ML W/VALVE (MISCELLANEOUS) ×3 IMPLANT
CHLORAPREP W/TINT 26ML (MISCELLANEOUS) ×6 IMPLANT
CLIP APPLIE ROT 13.4 12 LRG (CLIP) IMPLANT
DECANTER SPIKE VIAL GLASS SM (MISCELLANEOUS) ×6 IMPLANT
DEFOGGER SCOPE WARMER CLEARIFY (MISCELLANEOUS) ×3 IMPLANT
DRAPE UTILITY 15X26 TOWEL STRL (DRAPES) ×6 IMPLANT
FILTER LAP SMOKE EVAC STRL (MISCELLANEOUS) ×3 IMPLANT
GLOVE BIO SURGEON STRL SZ8 (GLOVE) ×6 IMPLANT
GOWN STRL REUS W/ TWL LRG LVL3 (GOWN DISPOSABLE) ×3 IMPLANT
GOWN STRL REUS W/ TWL XL LVL3 (GOWN DISPOSABLE) ×2 IMPLANT
GOWN STRL REUS W/TWL LRG LVL3 (GOWN DISPOSABLE) ×6
GOWN STRL REUS W/TWL XL LVL3 (GOWN DISPOSABLE) ×4
IRRIGATION STRYKERFLOW (MISCELLANEOUS) ×1 IMPLANT
IRRIGATOR STRYKERFLOW (MISCELLANEOUS) ×3
IV NS 1000ML (IV SOLUTION) ×2
IV NS 1000ML BAXH (IV SOLUTION) ×1 IMPLANT
KIT RM TURNOVER STRD PROC AR (KITS) ×3 IMPLANT
LABEL OR SOLS (LABEL) ×3 IMPLANT
LIQUID BAND (GAUZE/BANDAGES/DRESSINGS) ×3 IMPLANT
NDL INSUFF 14G 150MM VS150000 (NEEDLE) ×3 IMPLANT
NDL SAFETY 22GX1.5 (NEEDLE) ×3 IMPLANT
NS IRRIG 500ML POUR BTL (IV SOLUTION) ×3 IMPLANT
PACK LAP CHOLECYSTECTOMY (MISCELLANEOUS) ×3 IMPLANT
RELOAD GREEN (STAPLE) ×3 IMPLANT
RELOAD STAPLER GOLD 60MM (STAPLE) ×4 IMPLANT
SHEARS HARMONIC ACE PLUS 45CM (MISCELLANEOUS) ×3 IMPLANT
SLEEVE ENDOPATH XCEL 5M (ENDOMECHANICALS) ×6 IMPLANT
SLEEVE GASTRECTOMY 36FR VISIGI (MISCELLANEOUS) ×3 IMPLANT
STAPLER ECHELON BIOABSB 60 FLE (MISCELLANEOUS) ×15 IMPLANT
STAPLER ECHELON LONG 60 440 (INSTRUMENTS) ×3 IMPLANT
STAPLER RELOAD GOLD 60MM (STAPLE) ×12
SUT DEVICE BRAIDED 0X39 (SUTURE) ×3 IMPLANT
SUT VIC AB 3-0 SH 27 (SUTURE) ×2
SUT VIC AB 3-0 SH 27X BRD (SUTURE) ×1 IMPLANT
SUT VIC AB 4-0 PS2 18 (SUTURE) ×6 IMPLANT
TROCAR BLADELESS 15MM (ENDOMECHANICALS) ×3 IMPLANT
TROCAR SL VERSASTEP 5M LG  B (MISCELLANEOUS) ×2
TROCAR SL VERSASTEP 5M LG B (MISCELLANEOUS) ×1 IMPLANT
TROCAR XCEL 12X100 BLDLESS (ENDOMECHANICALS) ×3 IMPLANT
TROCAR XCEL NON-BLD 5MMX100MML (ENDOMECHANICALS) ×3 IMPLANT
TUBING INSUFFLATOR HEATED (MISCELLANEOUS) ×3 IMPLANT
WATER STERILE IRR 1000ML POUR (IV SOLUTION) ×3 IMPLANT

## 2016-04-22 NOTE — H&P (Signed)
Pt has been reexamined and chart reviewed there are no interval changes in the documented history and physical.

## 2016-04-22 NOTE — Anesthesia Procedure Notes (Addendum)
Procedure Name: Intubation Date/Time: 04/22/2016 1:30 PM Performed by: Johnna Acosta Pre-anesthesia Checklist: Patient identified, Emergency Drugs available, Suction available, Patient being monitored and Timeout performed Patient Re-evaluated:Patient Re-evaluated prior to inductionOxygen Delivery Method: Circle system utilized Preoxygenation: Pre-oxygenation with 100% oxygen Intubation Type: IV induction Ventilation: Mask ventilation without difficulty Laryngoscope Size: Mac and 3 Grade View: Grade I Tube type: Oral Number of attempts: 2 (attempt #1 limited mouth opening, repositioned head by removing square pillow.  attempt #2 with head on bed in sniffing position) Airway Equipment and Method: Stylet Placement Confirmation: ETT inserted through vocal cords under direct vision,  positive ETCO2 and breath sounds checked- equal and bilateral Secured at: 22 cm Tube secured with: Tape Dental Injury: Teeth and Oropharynx as per pre-operative assessment  Difficulty Due To: Difficulty was unanticipated Comments: Intubation by Larina Bras, CRNA during break

## 2016-04-22 NOTE — Op Note (Signed)
   Pre-op dx: morbid obesity Post-op dx: same Procedure: lap sleeve gastrectomy Surgeon: Darnell Level Asst: none EBL: none Anesth: GETA Specimen: portion of stomach Bougie: 28 Fr Distance from pylorus: 5 cm  Details of procedure: The patient was taken to the operating room and placed on the operating table in the supine position. Timeout was performed. SCD's were placed. The patient was placed under general anesthesia without incident. Broad spectrum antibiotics were given. A 36 french bougie was placed.  The abdomen was prepped and draped in the usual fashion. It was accessed using a 5 mm optical trocar in the left upper quadrant. Pneumoperitoneum was established without incident. Multiple other trocars were placed in preparation for the procedure. A liver retractor was placed.   The hiatus was explored. No significant hiatal hernia was present. The greater curve was mobilized 5 cm from the pylorus using the harmonic scalpel. This continued dividing the short gastrics and mobilizing the fundus off the left hemidiaphragm. The posterior lesser sac adhesions were divided as well. The Visi-G was advanced to the antrum and the antrum was bisected with an Echelon 60 mm green load stapler. All staple loads had buttressing material. The remainder of the sleeve was created by firing gold load staplers through the left-sided 12 mm port parallel to the lesser curve leaving a small tail at the angle of His. No bleeding or leakage was present at the staple line.  The divided gastric tissue was removed without spillage and sent to pathology. The trocars and liver retractor were removed without incident.The wounds were closed with 4-0 Vicryl and dermabond.The patient arrived at the recovery room in stable condition.

## 2016-04-22 NOTE — Anesthesia Postprocedure Evaluation (Signed)
Anesthesia Post Note  Patient: Sandra Sanders  Procedure(s) Performed: Procedure(s) (LRB): LAPAROSCOPIC GASTRIC SLEEVE RESECTION (N/A)  Patient location during evaluation: PACU Anesthesia Type: General Level of consciousness: awake and alert Pain management: pain level controlled Vital Signs Assessment: post-procedure vital signs reviewed and stable Respiratory status: spontaneous breathing, nonlabored ventilation, respiratory function stable and patient connected to nasal cannula oxygen Cardiovascular status: blood pressure returned to baseline and stable Postop Assessment: no signs of nausea or vomiting Anesthetic complications: no    Last Vitals:  Vitals:   04/22/16 1700 04/22/16 1802  BP: (!) 100/57 (!) 98/57  Pulse: (!) 56 (!) 46  Resp: 18 16  Temp: 36.8 C 36.8 C    Last Pain:  Vitals:   04/22/16 1802  TempSrc: Oral  PainSc:                  Martha Clan

## 2016-04-22 NOTE — OR Nursing (Signed)
Spoke with Dr Andree Elk concerning patient's PFO... Our standard IV tubing is ok to use per Dr Andree Elk

## 2016-04-22 NOTE — Anesthesia Preprocedure Evaluation (Signed)
Anesthesia Evaluation  Patient identified by MRN, date of birth, ID band Patient awake    Reviewed: Allergy & Precautions, H&P , NPO status , Patient's Chart, lab work & pertinent test results, reviewed documented beta blocker date and time   Airway Mallampati: III  TM Distance: >3 FB Neck ROM: full    Dental  (+) Teeth Intact   Pulmonary neg pulmonary ROS, asthma , former smoker,    Pulmonary exam normal        Cardiovascular + Peripheral Vascular Disease  negative cardio ROS Normal cardiovascular exam Rhythm:regular Rate:Normal     Neuro/Psych  Headaches, negative neurological ROS  negative psych ROS   GI/Hepatic negative GI ROS, Neg liver ROS, GERD  Medicated,  Endo/Other  negative endocrine ROSdiabetesMorbid obesity  Renal/GU negative Renal ROS  negative genitourinary   Musculoskeletal   Abdominal   Peds  Hematology negative hematology ROS (+) anemia ,   Anesthesia Other Findings Past Medical History: No date: Anemia No date: Anxiety No date: Arthritis No date: Asthma No date: Cancer [facility])     Comment: Cervical Cancer No date: Diabetes mellitus without complication (HCC) No date: GERD (gastroesophageal reflux disease) No date: Headache No date: Peripheral vascular disease ([place])     Comment: ablation right and left veins in legs No date: PFO (patent foramen ovale)     Comment: as an infant No date: Thyroid disease Past Surgical History: No date: ABDOMINAL HYSTERECTOMY No date: CHOLECYSTECTOMY No date: KNEE SURGERY Left No date: SEPTOPLASTY BMI    Body Mass Index:  35.05 kg/m     Reproductive/Obstetrics negative OB ROS                             Anesthesia Physical Anesthesia Plan  ASA: III  Anesthesia Plan: General ETT   Post-op Pain Management:    Induction:   Airway Management Planned:   Additional Equipment:   Intra-op Plan:   Post-operative Plan:    Informed Consent: I have reviewed the patients History and Physical, chart, labs and discussed the procedure including the risks, benefits and alternatives for the proposed anesthesia with the patient or authorized representative who has indicated his/her understanding and acceptance.   Dental Advisory Given  Plan Discussed with: CRNA  Anesthesia Plan Comments:         Anesthesia Quick Evaluation

## 2016-04-22 NOTE — Transfer of Care (Signed)
Immediate Anesthesia Transfer of Care Note  Patient: Sandra Sanders  Procedure(s) Performed: Procedure(s): LAPAROSCOPIC GASTRIC SLEEVE RESECTION (N/A)  Patient Location: PACU  Anesthesia Type:General  Level of Consciousness: sedated  Airway & Oxygen Therapy: Patient Spontanous Breathing and Patient connected to face mask oxygen  Post-op Assessment: Report given to RN and Post -op Vital signs reviewed and stable  Post vital signs: Reviewed and stable  Last Vitals:  Vitals:   04/22/16 1240 04/22/16 1501  BP:    Pulse:  77  Resp:  (!) 23  Temp: 36.9 C 36.2 C    Last Pain:  Vitals:   04/22/16 1501  TempSrc: Tympanic  PainSc:          Complications: No apparent anesthesia complications

## 2016-04-23 LAB — CBC WITH DIFFERENTIAL/PLATELET
Basophils Absolute: 0 10*3/uL (ref 0–0.1)
Basophils Relative: 0 %
EOS ABS: 0 10*3/uL (ref 0–0.7)
EOS PCT: 0 %
HCT: 36.3 % (ref 35.0–47.0)
Hemoglobin: 12.5 g/dL (ref 12.0–16.0)
LYMPHS ABS: 0.9 10*3/uL — AB (ref 1.0–3.6)
LYMPHS PCT: 9 %
MCH: 27.7 pg (ref 26.0–34.0)
MCHC: 34.6 g/dL (ref 32.0–36.0)
MCV: 80.2 fL (ref 80.0–100.0)
MONO ABS: 0.6 10*3/uL (ref 0.2–0.9)
MONOS PCT: 6 %
Neutro Abs: 9.1 10*3/uL — ABNORMAL HIGH (ref 1.4–6.5)
Neutrophils Relative %: 85 %
PLATELETS: 219 10*3/uL (ref 150–440)
RBC: 4.52 MIL/uL (ref 3.80–5.20)
RDW: 14.7 % — AB (ref 11.5–14.5)
WBC: 10.7 10*3/uL (ref 3.6–11.0)

## 2016-04-23 LAB — HEMOGLOBIN AND HEMATOCRIT, BLOOD
HCT: 37.5 % (ref 35.0–47.0)
HEMOGLOBIN: 12.7 g/dL (ref 12.0–16.0)

## 2016-04-23 LAB — POTASSIUM: Potassium: 4.8 mmol/L (ref 3.5–5.1)

## 2016-04-23 MED ORDER — METHYLPREDNISOLONE SODIUM SUCC 125 MG IJ SOLR: 125.0000 mg | Freq: Once | INTRAMUSCULAR | Status: DC

## 2016-04-23 MED ORDER — DEXAMETHASONE SODIUM PHOSPHATE 4 MG/ML IJ SOLN
8.0000 mg | Freq: Once | INTRAMUSCULAR | Status: AC
Start: 2016-04-23 — End: 2016-04-23
  Administered 2016-04-23: 8 mg via INTRAVENOUS
  Filled 2016-04-23: qty 2

## 2016-04-23 MED ORDER — PROMETHAZINE HCL 25 MG/ML IJ SOLN
12.5000 mg | Freq: Four times a day (QID) | INTRAMUSCULAR | Status: DC | PRN
  Administered 2016-04-23 – 2016-04-24 (×4): 12.5 mg via INTRAVENOUS
  Filled 2016-04-23 (×4): qty 1

## 2016-04-23 MED ORDER — METHYLPREDNISOLONE SODIUM SUCC 125 MG IJ SOLR: 60.0000 mg | Freq: Once | INTRAMUSCULAR | Status: DC

## 2016-04-23 NOTE — Progress Notes (Signed)
Assessment/Plan:  POD1 S/P  Procedure(s) (LRB): LAPAROSCOPIC GASTRIC SLEEVE RESECTION (N/A) LAPAROSCOPIC GASTRIC SLEEVE RESECTION (N/A )  1) Add IV Phenergan for nausea control 2) Encourage ambulation - To decrease the risk of DVT's.  May remove SCD's when ambulating well. 3) Incentive spirometry use - To decrease any atelectasis from surgery and so we can wean off O2 therapy. 4) D/C Foley - To decrease the possibility of any post op urinary tract infections. 5) Dietary and Lovenox teaching if necessary. 6) Discharge within 24-48 hours if she meets PO intake goals and adequate nausea control. Discussed with attending   Active Problems:   Morbid obesity ([place])     LOS: 1 day    Subjective: Pt with a good course post-op. No complaints. negative for Pain, positive for nausea, positive for Emesis, negative for Flatus, negative for Bowel Movement, positive for Ambulation.     Interval History: has complaint of persistent nausea and vomiting with all PO attempts.   Objective:  Vital signs in last 24 hours: Temp:  [97.1 F (36.2 C)-99 F (37.2 C)] 98.2 F (36.8 C) (08/02 0505) Pulse Rate:  [46-78] 52 (08/02 0505) Resp:  [10-28] 18 (08/02 0505) BP: (98-129)/(57-75) 110/63 (08/02 0505) SpO2:  [89 %-100 %] 98 % (08/02 0505)  Intake/Output last 3 shifts: I/O last 3 completed shifts: In: 2213.7 [I.V.:2213.7] Out: 405 [Urine:400; Blood:5] Intake/Output this shift: No intake/output data recorded.  Test Results  Lab Results  Component Value Date   WBC 10.7 04/23/2016   HGB 12.5 04/23/2016   HCT 36.3 04/23/2016   PLT 219 04/23/2016    Lab Results  Component Value Date   NA 140 04/22/2016   K 4.8 04/23/2016   CL 104 04/08/2016   CO2 27 04/08/2016   BUN 11 04/08/2016   CREATININE 0.67 04/08/2016   CALCIUM 9.2 04/08/2016    No results found for: ALKPHOS, BILITOT, BILIDIR, PROT, ALBUMIN, ALT, AST, GGT  Lab Results  Component Value Date   INR 1.03 04/08/2016   APTT 30  04/08/2016    Physical Exam  Abdomen: Soft, N/D, appropriately tender, Incisions C/D/I

## 2016-04-23 NOTE — Progress Notes (Addendum)
INTERVENTION:  RD consulted for nutrition education regarding inpatient bariatric surgery.   Pt reports she met with RD outpatient prior to admission and aware that she should be drinking liquids at this time.   RD provided "The Liquid Diet" handout from the Bariatric Surgery Guide from the [facility] [place]. This handout previously provided to patient prior to surgery is a duplicate copy. Discussed what foods/liquids are consistent with a Clear Liquid Diet and reinforced Key Concepts such as no carbonation, no caffeine, or sugar containing beverages. Provided methods to prevent dehydration and promote protein intake, using clock and sample fluid schedule. RD encouraged follow-up with outpatient dietitian after discharge.  Teach back method used.  Expect good compliance.  -Noticed regular apple juice at bedside opened. Pt reports came up on supper tray last night. Pt aware that she should NOT be drinking regular juice and reports has been diluting it.  Followed up with Patient Services Manager regarding bariatric clear liquid diet and items being sent.    NUTRITION DIAGNOSIS:  Food and nutrition knowledge related deficit related to recent bariatric surgery as evidenced by dietitian consult for nutrition education   GOAL:  Patient will be able to sip and tolerate CL within 24-48 hours  MONITOR:  Energy intake Digestive system  ASSESSMENT:  Pt s/p gastric sleeve. Feeling nauseated this am   Body mass index is 35.05 kg/m.   Current diet order is bariatric clear liquids, patient is consuming approximately few sips at this time.   Labs and medications reviewed.   LOW Care Level  Jerrit Horen B. Zenia Resides, [place], [place] (pager) Weekend/On-Call pager [phone])

## 2016-04-24 LAB — CREATININE, SERUM
Creatinine, Ser: 0.55 mg/dL (ref 0.44–1.00)
GFR calc Af Amer: >60 mL/min (ref >60)

## 2016-04-24 NOTE — Progress Notes (Signed)
Patient discharged home with family.  All discharge instructions reviewed and discharge paperwork given to patient.  Patient verbalized understanding.  IVs removed in tact. Prescriptions called into [facility] and pt aware, all questions and concerns addressed. Patient's family at bedside for transfer home.

## 2016-04-24 NOTE — Discharge Summary (Signed)
Physician Discharge Summary  Patient ID: Sandra Sanders MRN: [id number] DOB/AGE: 1981/06/18 35 y.o.  Admit date: 04/22/2016 Discharge date: 04/24/2016  Admission Diagnoses:  Discharge Diagnoses:  Active Problems:   Morbid obesity ([place])   Discharged Condition: good  Hospital Course: Pt struggling with nausea and retching POD1. There was marked improvement day2. Pt meeting po intake goals and nausea/pain controlled with oral meds.  Consults: None  Significant Diagnostic Studies: labs: see chart  Treatments: surgery: lap sleeve gastrectomy  Discharge Exam: Blood pressure 118/62, pulse (!) 59, temperature 98.7 F (37.1 C), temperature source Oral, resp. rate 18, height 5\' 8"  (1.727 m), weight 104.6 kg (230 lb 8 oz), SpO2 100 %.    Disposition: 01-Home or Self Care     Medication List    STOP taking these medications   ALEVE 220 MG Caps Generic drug:  Naproxen Sodium   amoxicillin-clavulanate 875-125 MG tablet Commonly known as:  AUGMENTIN   brompheniramine-pseudoephedrine-DM 30-2-10 MG/5ML syrup   furosemide 40 MG tablet Commonly known as:  LASIX   ibuprofen 800 MG tablet Commonly known as:  ADVIL,MOTRIN   metFORMIN 500 MG tablet Commonly known as:  GLUCOPHAGE   naproxen 500 MG tablet Commonly known as:  NAPROSYN     TAKE these medications   albuterol 108 (90 Base) MCG/ACT inhaler Commonly known as:  PROVENTIL HFA;VENTOLIN HFA Inhale 2 puffs into the lungs every 6 (six) hours as needed for wheezing or shortness of breath.   thyroid 60 MG tablet Commonly known as:  ARMOUR Take 60 mg by mouth 2 (two) times daily.        SignedCarmelina Noun 04/24/2016, 1:30 PM

## 2016-04-28 LAB — SURGICAL PATHOLOGY

## 2016-11-19 DIAGNOSIS — K921 Melena: Secondary | ICD-10-CM | POA: Insufficient documentation

## 2017-05-26 DIAGNOSIS — Z9884 Bariatric surgery status: Secondary | ICD-10-CM | POA: Insufficient documentation

## 2017-05-26 DIAGNOSIS — K219 Gastro-esophageal reflux disease without esophagitis: Secondary | ICD-10-CM | POA: Insufficient documentation

## 2017-08-26 ENCOUNTER — Emergency Department
Admission: EM | Admit: 2017-08-26 | Discharge: 2017-08-26 | Disposition: A | Discharge status: Home / Self Care | Payer: Commercial Managed Care - PPO | Attending: Emergency Medicine | Admitting: Emergency Medicine

## 2017-08-26 ENCOUNTER — Other Ambulatory Visit: Payer: Self-pay

## 2017-08-26 ENCOUNTER — Encounter: Payer: Self-pay | Admitting: Emergency Medicine

## 2017-08-26 ENCOUNTER — Emergency Department: Payer: Commercial Managed Care - PPO

## 2017-08-26 DIAGNOSIS — F419 Anxiety disorder, unspecified: Secondary | ICD-10-CM | POA: Diagnosis not present

## 2017-08-26 DIAGNOSIS — Z79899 Other long term (current) drug therapy: Secondary | ICD-10-CM | POA: Diagnosis not present

## 2017-08-26 DIAGNOSIS — Z8541 Personal history of malignant neoplasm of cervix uteri: Secondary | ICD-10-CM | POA: Diagnosis not present

## 2017-08-26 DIAGNOSIS — Z862 Personal history of diseases of the blood and blood-forming organs and certain disorders involving the immune mechanism: Secondary | ICD-10-CM | POA: Diagnosis not present

## 2017-08-26 DIAGNOSIS — Z9049 Acquired absence of other specified parts of digestive tract: Secondary | ICD-10-CM | POA: Insufficient documentation

## 2017-08-26 DIAGNOSIS — J45909 Unspecified asthma, uncomplicated: Secondary | ICD-10-CM | POA: Diagnosis not present

## 2017-08-26 DIAGNOSIS — Z87891 Personal history of nicotine dependence: Secondary | ICD-10-CM | POA: Insufficient documentation

## 2017-08-26 DIAGNOSIS — N644 Mastodynia: Secondary | ICD-10-CM | POA: Insufficient documentation

## 2017-08-26 DIAGNOSIS — E119 Type 2 diabetes mellitus without complications: Secondary | ICD-10-CM | POA: Diagnosis not present

## 2017-08-26 LAB — CBC WITH DIFFERENTIAL/PLATELET
Basophils Absolute: 0 10*3/uL (ref 0–0.1)
Basophils Relative: 1 %
EOS PCT: 3 %
Eosinophils Absolute: 0.2 10*3/uL (ref 0–0.7)
HCT: 39.5 % (ref 35.0–47.0)
Hemoglobin: 13.4 g/dL (ref 12.0–16.0)
LYMPHS ABS: 1.1 10*3/uL (ref 1.0–3.6)
LYMPHS PCT: 21 %
MCH: 29.4 pg (ref 26.0–34.0)
MCHC: 34 g/dL (ref 32.0–36.0)
MCV: 86.4 fL (ref 80.0–100.0)
MONO ABS: 0.3 10*3/uL (ref 0.2–0.9)
Monocytes Relative: 6 %
Neutro Abs: 3.6 10*3/uL (ref 1.4–6.5)
Neutrophils Relative %: 69 %
PLATELETS: 232 10*3/uL (ref 150–440)
RBC: 4.57 MIL/uL (ref 3.80–5.20)
RDW: 13.9 % (ref 11.5–14.5)
WBC: 5.2 10*3/uL (ref 3.6–11.0)

## 2017-08-26 LAB — PROTIME-INR
INR: 1.02
Prothrombin Time: 13.3 seconds (ref 11.4–15.2)

## 2017-08-26 LAB — COMPREHENSIVE METABOLIC PANEL
ALBUMIN: 4.1 g/dL (ref 3.5–5.0)
ALK PHOS: 63 U/L (ref 38–126)
ALT: 21 U/L (ref 14–54)
ANION GAP: 6 (ref 5–15)
AST: 25 U/L (ref 15–41)
BUN: 13 mg/dL (ref 6–20)
CHLORIDE: 107 mmol/L (ref 101–111)
CO2: 23 mmol/L (ref 22–32)
CREATININE: 0.55 mg/dL (ref 0.44–1.00)
Calcium: 8.9 mg/dL (ref 8.9–10.3)
GFR calc Af Amer: >60 mL/min (ref >60)
GFR calc non Af Amer: >60 mL/min (ref >60)
GLUCOSE: 88 mg/dL (ref 65–99)
Potassium: 3.7 mmol/L (ref 3.5–5.1)
SODIUM: 136 mmol/L (ref 135–145)
Total Bilirubin: 0.9 mg/dL (ref 0.3–1.2)
Total Protein: 6.7 g/dL (ref 6.5–8.1)

## 2017-08-26 LAB — APTT: APTT: 30 s (ref 24–36)

## 2017-08-26 MED ORDER — NAPROXEN SODIUM 550 MG PO TABS: 550.0000 mg | ORAL_TABLET | Freq: Two times a day (BID) | ORAL | 0 refills | Status: DC

## 2017-08-26 MED ORDER — CEPHALEXIN 500 MG PO CAPS: 500.0000 mg | ORAL_CAPSULE | Freq: Three times a day (TID) | ORAL | 0 refills | Status: DC

## 2017-08-26 NOTE — Discharge Instructions (Signed)
Started taking Anaprox DS twice a day with food. This is an anti-inflammatory which should help with breast pain. Also Keflex 500 mg 3 times a day for 10 days. Follow-up with your primary care doctor in 10-14 days. Return to the emergency department if any severe worsening of your symptoms, fever, increased pain.

## 2017-08-26 NOTE — ED Triage Notes (Signed)
PT c/o RT breast pain xfew weeks but getting worse. PT states she was in [place] last week for work. Pt A&Ox4, Pt denies any rash or blisters.

## 2017-08-26 NOTE — ED Provider Notes (Signed)
[facility] Emergency Department Provider Note  ____________________________________________   First MD Initiated Contact with Patient 08/26/17 [phone]     (approximate)  I have reviewed the triage vital signs and the nursing notes.   HISTORY  Chief Complaint Breast Pain   HPI Sandra Sanders is a 36 y.o. female she is complaining of right breast pain for approximately 2-3 weeks. Patient states that it began a week before she left for [place]. She states she's had intermittent pain. She denies any difficulty breathing. She denies any previous DVTs. She states this morning with movement it was much more painful. She has not taken any over-the-counter medication. She denies any rash, discharge, trauma to this area. She is also not seen any discharge from the nipple or erythema.  She denies any leg discomfort, pain, swelling. Patient also states that she has noticed some bruising and cannot account for this. She denies any aspirin intake or on chronic anti-inflammatories. She rates her pain as 5/10.   Past Medical History:  Diagnosis Date  . Anemia   . Anxiety   . Arthritis   . Asthma   . Cancer (HCC)    Cervical Cancer  . Diabetes mellitus without complication ([place])   . GERD (gastroesophageal reflux disease)   . Headache   . Peripheral vascular disease ([place])    ablation right and left veins in legs  . PFO (patent foramen ovale)    as an infant  . Thyroid disease     Patient Active Problem List   Diagnosis Date Noted  . Morbid obesity ([place]) 04/22/2016    Past Surgical History:  Procedure Laterality Date  . ABDOMINAL HYSTERECTOMY    . CHOLECYSTECTOMY    . KNEE SURGERY Left   . LAPAROSCOPIC GASTRIC SLEEVE RESECTION N/A 04/22/2016   Procedure: LAPAROSCOPIC GASTRIC SLEEVE RESECTION;  Surgeon: Bonner Puna, MD;  Location: [facility] ORS;  Service: General;  Laterality: N/A;  . SEPTOPLASTY      Prior to Admission medications   Medication Sig Start Date End  Date Taking? Authorizing Provider  albuterol (PROVENTIL HFA;VENTOLIN HFA) 108 (90 Base) MCG/ACT inhaler Inhale 2 puffs into the lungs every 6 (six) hours as needed for wheezing or shortness of breath.    [provider]  cephALEXin (KEFLEX) 500 MG capsule Take 1 capsule (500 mg total) by mouth 3 (three) times daily. 08/26/17   Johnn Hai, PA-C  naproxen sodium (ANAPROX DS) 550 MG tablet Take 1 tablet (550 mg total) by mouth 2 (two) times daily with a meal. 08/26/17   Johnn Hai, PA-C  thyroid (ARMOUR) 60 MG tablet Take 60 mg by mouth 2 (two) times daily.    [provider]    Allergies Other and Dilaudid [hydromorphone]  Family History  Problem Relation Age of Onset  . Hypertension Mother   . Hypertension Father     Social History Social History   Tobacco Use  . Smoking status: Former Smoker    Packs/day: 1.00    Types: Cigarettes    Last attempt to quit: 03/22/2004    Years since quitting: 13.4  . Smokeless tobacco: Never Used  Substance Use Topics  . Alcohol use: No  . Drug use: No    Review of Systems Constitutional: No fever/chills Cardiovascular: Denies chest pain. Respiratory: Denies shortness of breath. Gastrointestinal: No abdominal pain.  No nausea, no vomiting.   Musculoskeletal: Negative for muscle aches. Skin: Negative for injury or rash. Neurological: Negative for headaches,  focal weakness or numbness. ____________________________________________   PHYSICAL EXAM:  VITAL SIGNS: ED Triage Vitals [08/26/17 0729]  Enc Vitals Group     BP 110/74     Pulse Rate 87     Resp 16     Temp 98.5 F (36.9 C)     Temp Source Oral     SpO2 (!) 88 %     Weight 170 lb (77.1 kg)     Height 5\' 9"  (1.753 m)     Head Circumference      Peak Flow      Pain Score 5     Pain Loc      Pain Edu?      Excl. in [place]?    Constitutional: Alert and oriented. Well appearing and in no acute distress. Eyes: Conjunctivae are normal.  Head:  Atraumatic. Nose: No congestion/rhinnorhea. Neck: No stridor.   Hematological/Lymphatic/Immunilogical: No cervical lymphadenopathy. Cardiovascular: Normal rate, regular rhythm. Grossly normal heart sounds.  Good peripheral circulation. Respiratory: Normal respiratory effort.  No retractions. Lungs CTAB. Right breast no dimpling or nipple discharge is noted. There is no erythema. No obvious deformity. There is no point tenderness on initial exam but multiple areas of mobile nodules consistent with fibrocystic breast. On second exam to the medial aspect there was tenderness but no nodule was noted. There is no erythema in the area. No soft tissue swelling was appreciated in comparison with the left. Gastrointestinal: Soft and nontender. No distention. No abdominal bruits. No CVA tenderness. Musculoskeletal: moves upper and lower extremities without any difficulty. Normal gait was noted. Neurologic:  Normal speech and language. No gross focal neurologic deficits are appreciated.  Skin:  Skin is warm, dry and intact. There are 3 bruises noted all in the state of healing. No new bruising is noted. No open skin. One is to the anterior chest,and back Psychiatric: Mood and affect are normal. Speech and behavior are normal.  ____________________________________________   LABS (all labs ordered are listed, but only abnormal results are displayed)  Labs Reviewed  CBC WITH DIFFERENTIAL/PLATELET  COMPREHENSIVE METABOLIC PANEL  PROTIME-INR  APTT    RADIOLOGY  Dg Chest 2 View  Result Date: 08/26/2017 CLINICAL DATA:  Right side chest pain for 2 weeks EXAM: CHEST  2 VIEW COMPARISON:  12/19/2015 FINDINGS: Heart and mediastinal contours are within normal limits. No focal opacities or effusions. No acute bony abnormality. IMPRESSION: No active cardiopulmonary disease. Electronically Signed   By: Rolm Baptise M.D.   On: 08/26/2017 09:46     ____________________________________________   PROCEDURES  Procedure(s) performed: None  Procedures  Critical Care performed: No  ____________________________________________   INITIAL IMPRESSION / ASSESSMENT AND PLAN / ED COURSE Patient was reassured that lab work today does not suggest any infection. Also chest x-ray was negative. At this time patient was placed on Anaprox 550 mg 1 twice a day with food for inflammation and also Keflex 500 mg 3 times a day should this be a atypical mastitis. Patient is to follow-up with her PCP in 10 days for recheck and possible ultrasound of the breast. Patient is aware to return to the emergency department if any severe worsening of her symptoms, fever, or shortness of breath.  ____________________________________________   FINAL CLINICAL IMPRESSION(S) / ED DIAGNOSES  Final diagnoses:  Pain of right breast     ED Discharge Orders        Ordered    cephALEXin (KEFLEX) 500 MG capsule  3 times daily  08/26/17 1103    naproxen sodium (ANAPROX DS) 550 MG tablet  2 times daily with meals     08/26/17 1103       Note:  This document was prepared using Dragon voice recognition software and may include unintentional dictation errors.    Johnn Hai, PA-C 08/26/17 1415    Carrie Mew, MD 08/26/17 [phone]

## 2017-10-06 ENCOUNTER — Encounter: Payer: Self-pay | Admitting: *Deleted

## 2017-10-06 ENCOUNTER — Other Ambulatory Visit: Payer: Self-pay

## 2017-10-06 ENCOUNTER — Ambulatory Visit
Admission: EM | Admit: 2017-10-06 | Discharge: 2017-10-06 | Disposition: A | Discharge status: Home / Self Care | Payer: Commercial Managed Care - PPO | Attending: Family Medicine | Admitting: Family Medicine

## 2017-10-06 DIAGNOSIS — J029 Acute pharyngitis, unspecified: Secondary | ICD-10-CM | POA: Diagnosis not present

## 2017-10-06 DIAGNOSIS — R0981 Nasal congestion: Secondary | ICD-10-CM | POA: Diagnosis not present

## 2017-10-06 DIAGNOSIS — R51 Headache: Secondary | ICD-10-CM

## 2017-10-06 LAB — RAPID STREP SCREEN (MED CTR MEBANE ONLY): Streptococcus, Group A Screen (Direct): NEGATIVE

## 2017-10-06 MED ORDER — AMOXICILLIN-POT CLAVULANATE 875-125 MG PO TABS: 1.0000 | ORAL_TABLET | Freq: Two times a day (BID) | ORAL | 0 refills | Status: DC

## 2017-10-06 NOTE — ED Triage Notes (Signed)
Patient started having severe sore throat, nasal congestion, and headache yesterday.

## 2017-10-06 NOTE — Discharge Instructions (Signed)
Use OTC flonase.  I suspect this is viral. However, given your complaints, fever, etc I am placing you on antibiotics until the culture returns.  Take care  Dr. Lacinda Axon

## 2017-10-06 NOTE — ED Provider Notes (Signed)
[facility]    CSN: [id number] Arrival date & time: 10/06/17  0801  History   Chief Complaint Chief Complaint  Patient presents with  . Sore Throat  . Nasal Congestion   HPI  37 year old female presents with sore throat.  Patient reports that she developed sore throat yesterday.  She reports associated headache, nasal congestion, and posterior neck pain, difficulty swallowing.  She reports that she has been febrile, T-max 100.6.  She states that she is taken NyQuil without resolution.  No known exacerbating factors.  No known exacerbating factors.  No other associated symptoms.  No other complaints or concerns at this time.  Past Medical History:  Diagnosis Date  . Anemia   . Anxiety   . Arthritis   . Asthma   . Cancer (HCC)    Cervical Cancer  . Diabetes mellitus without complication ([place])   . GERD (gastroesophageal reflux disease)   . Headache   . Peripheral vascular disease ([place])    ablation right and left veins in legs  . PFO (patent foramen ovale)    as an infant  . Thyroid disease    Patient Active Problem List   Diagnosis Date Noted  . Morbid obesity ([place]) 04/22/2016   Past Surgical History:  Procedure Laterality Date  . ABDOMINAL HYSTERECTOMY    . CHOLECYSTECTOMY    . KNEE SURGERY Left   . LAPAROSCOPIC GASTRIC SLEEVE RESECTION N/A 04/22/2016   Procedure: LAPAROSCOPIC GASTRIC SLEEVE RESECTION;  Surgeon: Bonner Puna, MD;  Location: [facility] ORS;  Service: General;  Laterality: N/A;  . SEPTOPLASTY     OB History    No data available     Home Medications    Prior to Admission medications   Medication Sig Start Date End Date Taking? Authorizing Provider  FLUoxetine (PROZAC) 20 MG tablet Take 20 mg by mouth 2 (two) times daily.   Yes [provider]  furosemide (LASIX) 40 MG tablet Take 40 mg by mouth daily.   Yes [provider]  thyroid (ARMOUR) 60 MG tablet Take 60 mg by mouth 2 (two) times daily.   Yes [provider]    albuterol (PROVENTIL HFA;VENTOLIN HFA) 108 (90 Base) MCG/ACT inhaler Inhale 2 puffs into the lungs every 6 (six) hours as needed for wheezing or shortness of breath.    [provider]  amoxicillin-clavulanate (AUGMENTIN) 875-125 MG tablet Take 1 tablet by mouth every 12 (twelve) hours. 10/06/17   Coral Spikes, DO   Family History Family History  Problem Relation Age of Onset  . Hypertension Mother   . Hypertension Father    Social History Social History   Tobacco Use  . Smoking status: Former Smoker    Packs/day: 1.00    Types: Cigarettes    Last attempt to quit: 03/22/2004    Years since quitting: 13.5  . Smokeless tobacco: Never Used  Substance Use Topics  . Alcohol use: No  . Drug use: No     Allergies   Other and Dilaudid [hydromorphone]   Review of Systems Review of Systems  Constitutional: Positive for fever.  HENT: Positive for congestion, sore throat and trouble swallowing.   Musculoskeletal: Positive for neck pain.   Physical Exam Triage Vital Signs ED Triage Vitals  Enc Vitals Group     BP 10/06/17 0812 91/60     Pulse Rate 10/06/17 0812 61     Resp 10/06/17 0812 16     Temp 10/06/17 0812 98.5 F (36.9 C)  Temp Source 10/06/17 0812 Oral     SpO2 10/06/17 0812 99 %     Weight 10/06/17 0813 165 lb (74.8 kg)     Height 10/06/17 0813 5\' 9"  (1.753 m)     Head Circumference --      Peak Flow --      Pain Score 10/06/17 0813 8     Pain Loc --      Pain Edu? --      Excl. in [place]? --    Updated Vital Signs BP 91/60 (BP Location: Left Arm)   Pulse 61   Temp 98.5 F (36.9 C) (Oral)   Resp 16   Ht 5\' 9"  (1.753 m)   Wt 165 lb (74.8 kg)   SpO2 99%   BMI 24.37 kg/m    Physical Exam  Constitutional: She is oriented to person, place, and time. She appears well-developed and well-nourished. No distress.  HENT:  Head: Normocephalic and atraumatic.  Oropharynx with moderate erythema.  No discrete exudate.  Postnasal drip noted.  Eyes:  Conjunctivae are normal. Right eye exhibits no discharge. Left eye exhibits no discharge.  Neck: Neck supple.  Cardiovascular: Normal rate and regular rhythm.  No murmur heard. Pulmonary/Chest: Effort normal and breath sounds normal. She has no wheezes. She has no rales.  Lymphadenopathy:    She has no cervical adenopathy.  Neurological: She is alert and oriented to person, place, and time.  Psychiatric: She has a normal mood and affect. Her behavior is normal.  Nursing note and vitals reviewed.  UC Treatments / Results  Labs (all labs ordered are listed, but only abnormal results are displayed) Labs Reviewed  RAPID STREP SCREEN (NOT AT [facility])  CULTURE, GROUP A STREP [facility])    EKG  EKG Interpretation None       Radiology No results found.  Procedures Procedures (including critical care time)  Medications Ordered in UC Medications - No data to display   Initial Impression / Assessment and Plan / UC Course  I have reviewed the triage vital signs and the nursing notes.  Pertinent labs & imaging results that were available during my care of the patient were reviewed by me and considered in my medical decision making (see chart for details).     37 year old female presents with severe sore throat, and congestion.  Patient is also been febrile.  Given her presentation, I suspect that this will return as viral in etiology.  However, she feels very poorly and is very symptomatic.  Placed her on Augmentin while we await the throat culture.  Final Clinical Impressions(s) / UC Diagnoses   Final diagnoses:  Pharyngitis, unspecified etiology    ED Discharge Orders        Ordered    amoxicillin-clavulanate (AUGMENTIN) 875-125 MG tablet  Every 12 hours     10/06/17 0833     Controlled Substance Prescriptions [place] Controlled Substance Registry consulted? Not Applicable   Coral Spikes, [place] 10/06/17 2979

## 2017-10-08 LAB — CULTURE, GROUP A STREP (THRC)

## 2017-10-29 ENCOUNTER — Ambulatory Visit
Admission: EM | Admit: 2017-10-29 | Discharge: 2017-10-29 | Disposition: A | Discharge status: Home / Self Care | Payer: Commercial Managed Care - PPO | Attending: Family Medicine | Admitting: Family Medicine

## 2017-10-29 ENCOUNTER — Encounter: Payer: Self-pay | Admitting: *Deleted

## 2017-10-29 ENCOUNTER — Other Ambulatory Visit: Payer: Self-pay

## 2017-10-29 DIAGNOSIS — J0100 Acute maxillary sinusitis, unspecified: Secondary | ICD-10-CM | POA: Diagnosis not present

## 2017-10-29 MED ORDER — LEVOFLOXACIN 500 MG PO TABS: 500.0000 mg | ORAL_TABLET | Freq: Every day | ORAL | 0 refills | Status: DC

## 2017-10-29 MED ORDER — GUAIFENESIN-CODEINE 100-10 MG/5ML PO SOLN: 5.0000 mL | Freq: Four times a day (QID) | ORAL | 0 refills | Status: DC | PRN

## 2017-10-29 NOTE — ED Triage Notes (Signed)
Patient's symptoms of nasal congestion, cough, and fever have not resolved since last visit on 10/06/2017.

## 2017-10-29 NOTE — ED Provider Notes (Signed)
[facility]    CSN: [id number] Arrival date & time: 10/29/17  1010  History   Chief Complaint Chief Complaint  Patient presents with  . Nasal Congestion  . Cough   HPI  37 year old female presents with the above complaints.  Patient states that she is been sick for 3 months.  She states that she has had several rounds of antibiotics.  I saw her recently for pharyngitis.  She had no other complaints at that time.  Patient presents today with severe nasal congestion, sinus pressure and pain.  She also reports severe cough which is productive of green sputum.  She states that she has had fever.  She states her maximum temperature has been 99.  She has no true fever.  No chills.  No shortness of breath.  No known exacerbating relieving factors.  Patient states she has an upcoming appointment with ENT but is later at the end of this month.  Patient feels very poorly and fatigue.  Symptoms are severe.  No other complaints or concerns at this time.  Past Medical History:  Diagnosis Date  . Anemia   . Anxiety   . Arthritis   . Asthma   . Cancer (HCC)    Cervical Cancer  . Diabetes mellitus without complication ([place])   . GERD (gastroesophageal reflux disease)   . Headache   . Peripheral vascular disease ([place])    ablation right and left veins in legs  . PFO (patent foramen ovale)    as an infant  . Thyroid disease    Patient Active Problem List   Diagnosis Date Noted  . Morbid obesity ([place]) 04/22/2016   Past Surgical History:  Procedure Laterality Date  . ABDOMINAL HYSTERECTOMY    . CHOLECYSTECTOMY    . KNEE SURGERY Left   . LAPAROSCOPIC GASTRIC SLEEVE RESECTION N/A 04/22/2016   Procedure: LAPAROSCOPIC GASTRIC SLEEVE RESECTION;  Surgeon: Bonner Puna, MD;  Location: [facility] ORS;  Service: General;  Laterality: N/A;  . SEPTOPLASTY     OB History    No data available     Home Medications    Prior to Admission medications   Medication Sig Start Date End Date Taking?  Authorizing Provider  albuterol (PROVENTIL HFA;VENTOLIN HFA) 108 (90 Base) MCG/ACT inhaler Inhale 2 puffs into the lungs every 6 (six) hours as needed for wheezing or shortness of breath.   Yes [provider]  FLUoxetine (PROZAC) 20 MG tablet Take 20 mg by mouth 2 (two) times daily.   Yes [provider]  furosemide (LASIX) 40 MG tablet Take 40 mg by mouth daily.   Yes [provider]  thyroid (ARMOUR) 60 MG tablet Take 60 mg by mouth 2 (two) times daily.   Yes [provider]  guaiFENesin-codeine 100-10 MG/5ML syrup Take 5 mLs by mouth every 6 (six) hours as needed for cough. 10/29/17   Coral Spikes, DO  levofloxacin (LEVAQUIN) 500 MG tablet Take 1 tablet (500 mg total) by mouth daily. 10/29/17   Coral Spikes, DO   Family History Family History  Problem Relation Age of Onset  . Hypertension Mother   . Hypertension Father    Social History Social History   Tobacco Use  . Smoking status: Former Smoker    Packs/day: 1.00    Types: Cigarettes    Last attempt to quit: 03/22/2004    Years since quitting: 13.6  . Smokeless tobacco: Never Used  Substance Use Topics  . Alcohol use: No  .  Drug use: No   Allergies   Other and Dilaudid [hydromorphone]   Review of Systems Review of Systems  Constitutional:       Subjective fever.  HENT: Positive for congestion, sinus pressure and sinus pain.   Respiratory: Positive for cough.    Physical Exam Triage Vital Signs ED Triage Vitals  Enc Vitals Group     BP 10/29/17 1026 92/60     Pulse Rate 10/29/17 1026 70     Resp 10/29/17 1026 16     Temp 10/29/17 1026 98.5 F (36.9 C)     Temp Source 10/29/17 1026 Oral     SpO2 10/29/17 1026 98 %     Weight --      Height --      Head Circumference --      Peak Flow --      Pain Score 10/29/17 1027 7     Pain Loc --      Pain Edu? --      Excl. in [place]? --    Updated Vital Signs BP 92/60 (BP Location: Left Arm)   Pulse 70   Temp 98.5 F (36.9 C) (Oral)    Resp 16   SpO2 98%   Physical Exam  Constitutional: She is oriented to person, place, and time. She appears well-developed.  Appears fatigued/ill.  HENT:  Head: Normocephalic and atraumatic.  Severe right maxillary sinus tenderness to palpation.  Cardiovascular: Normal rate and regular rhythm.  No murmur heard. Pulmonary/Chest: Effort normal and breath sounds normal.  Neurological: She is alert and oriented to person, place, and time.  Psychiatric: She has a normal mood and affect. Her behavior is normal.  Nursing note and vitals reviewed.  UC Treatments / Results  Labs (all labs ordered are listed, but only abnormal results are displayed) Labs Reviewed - No data to display  EKG  EKG Interpretation None       Radiology No results found.  Procedures Procedures (including critical care time)  Medications Ordered in UC Medications - No data to display   Initial Impression / Assessment and Plan / UC Course  I have reviewed the triage vital signs and the nursing notes.  Pertinent labs & imaging results that were available during my care of the patient were reviewed by me and considered in my medical decision making (see chart for details).     37 year old female presents with signs and symptoms of maxillary sinusitis.  Has had multiple courses of antibiotics recently, predominately from a tele-doc.  Given this, treating with Levaquin.  Final Clinical Impressions(s) / UC Diagnoses   Final diagnoses:  Acute maxillary sinusitis, recurrence not specified    ED Discharge Orders        Ordered    levofloxacin (LEVAQUIN) 500 MG tablet  Daily     10/29/17 1220    guaiFENesin-codeine 100-10 MG/5ML syrup  Every 6 hours PRN     10/29/17 1221     Controlled Substance Prescriptions [place] Controlled Substance Registry consulted? Not Applicable   Coral Spikes, DO 10/29/17 1305

## 2017-10-29 NOTE — Discharge Instructions (Signed)
Rest.  Antibiotic as prescribed.  Use the cough medication if you need it.  Take care  Dr. Lacinda Axon

## 2017-12-30 DIAGNOSIS — M5137 Other intervertebral disc degeneration, lumbosacral region: Secondary | ICD-10-CM | POA: Insufficient documentation

## 2018-09-23 DIAGNOSIS — E538 Deficiency of other specified B group vitamins: Secondary | ICD-10-CM | POA: Insufficient documentation

## 2018-09-30 DIAGNOSIS — E063 Autoimmune thyroiditis: Secondary | ICD-10-CM | POA: Diagnosis not present

## 2018-09-30 DIAGNOSIS — E039 Hypothyroidism, unspecified: Secondary | ICD-10-CM | POA: Diagnosis not present

## 2018-09-30 DIAGNOSIS — E282 Polycystic ovarian syndrome: Secondary | ICD-10-CM | POA: Diagnosis not present

## 2018-09-30 DIAGNOSIS — D511 Vitamin B12 deficiency anemia due to selective vitamin B12 malabsorption with proteinuria: Secondary | ICD-10-CM | POA: Diagnosis not present

## 2018-09-30 DIAGNOSIS — E749 Disorder of carbohydrate metabolism, unspecified: Secondary | ICD-10-CM | POA: Diagnosis not present

## 2018-09-30 DIAGNOSIS — R600 Localized edema: Secondary | ICD-10-CM | POA: Diagnosis not present

## 2018-10-14 DIAGNOSIS — J1189 Influenza due to unidentified influenza virus with other manifestations: Secondary | ICD-10-CM | POA: Diagnosis not present

## 2018-10-16 DIAGNOSIS — R7303 Prediabetes: Secondary | ICD-10-CM | POA: Diagnosis not present

## 2018-11-05 DIAGNOSIS — R7303 Prediabetes: Secondary | ICD-10-CM | POA: Diagnosis not present

## 2018-12-16 DIAGNOSIS — J301 Allergic rhinitis due to pollen: Secondary | ICD-10-CM | POA: Diagnosis not present

## 2019-02-03 DIAGNOSIS — G43809 Other migraine, not intractable, without status migrainosus: Secondary | ICD-10-CM | POA: Diagnosis not present

## 2019-02-04 ENCOUNTER — Ambulatory Visit
Admission: EM | Admit: 2019-02-04 | Discharge: 2019-02-04 | Disposition: A | Discharge status: Home / Self Care | Payer: Commercial Managed Care - PPO | Attending: Family Medicine | Admitting: Family Medicine

## 2019-02-04 DIAGNOSIS — G43919 Migraine, unspecified, intractable, without status migrainosus: Secondary | ICD-10-CM

## 2019-02-04 MED ORDER — PROMETHAZINE HCL 25 MG PO TABS: 25.0000 mg | ORAL_TABLET | Freq: Four times a day (QID) | ORAL | Status: DC | PRN

## 2019-02-04 MED ORDER — PROMETHAZINE HCL 25 MG/ML IJ SOLN
25.0000 mg | Freq: Once | INTRAMUSCULAR | Status: AC
  Administered 2019-02-04: 09:00:00 25 mg via INTRAMUSCULAR

## 2019-02-04 MED ORDER — SUMATRIPTAN SUCCINATE 6 MG/0.5ML ~~LOC~~ SOLN
6.0000 mg | Freq: Once | SUBCUTANEOUS | Status: AC
  Administered 2019-02-04: 6 mg via SUBCUTANEOUS

## 2019-02-04 MED ORDER — SUMATRIPTAN SUCCINATE 50 MG PO TABS: 50.0000 mg | ORAL_TABLET | Freq: Once | ORAL | 0 refills | Status: DC

## 2019-02-04 NOTE — ED Triage Notes (Signed)
Pt here for headache since wed. Was given zofran for nausea via telemedicine dr. And states the zofran has not helped and states she hasn't slept in 2 days and was advised to come here.

## 2019-02-04 NOTE — ED Provider Notes (Signed)
[facility]    CSN: [id number] Arrival date & time: 02/04/19  0801  History   Chief Complaint Chief Complaint  Patient presents with  . Headache   HPI  38 year old female presents with headache.  Patient reports that she has had a headache since Wednesday.  Right-sided.  Throbbing in character.  Severe.  Interfering with sleep.  She has tried Tylenol, Excedrin, ibuprofen without resolution.  She recently received a prescription for Zofran from a telemedicine visit.  Zofran has not helped.  Continues to have severe headache.  Associated photophobia and phonophobia.  No vomiting.  No other associated symptoms.  No other complaints at this time.  Pain 8/10 currently.  PMH, Surgical Hx, Family Hx, Social History reviewed and updated as below.  Past Medical History:  Diagnosis Date  . Anemia   . Anxiety   . Arthritis   . Asthma   . Cancer (HCC)    Cervical Cancer  . Diabetes mellitus without complication ([place])   . GERD (gastroesophageal reflux disease)   . Headache   . Peripheral vascular disease ([place])    ablation right and left veins in legs  . PFO (patent foramen ovale)    as an infant  . Thyroid disease     Patient Active Problem List   Diagnosis Date Noted  . Morbid obesity ([place]) 04/22/2016    Past Surgical History:  Procedure Laterality Date  . ABDOMINAL HYSTERECTOMY    . CHOLECYSTECTOMY    . KNEE SURGERY Left   . LAPAROSCOPIC GASTRIC SLEEVE RESECTION N/A 04/22/2016   Procedure: LAPAROSCOPIC GASTRIC SLEEVE RESECTION;  Surgeon: Bonner Puna, MD;  Location: [facility] ORS;  Service: General;  Laterality: N/A;  . SEPTOPLASTY      OB History   No obstetric history on file.      Home Medications    Prior to Admission medications   Medication Sig Start Date End Date Taking? Authorizing Provider  albuterol (PROVENTIL HFA;VENTOLIN HFA) 108 (90 Base) MCG/ACT inhaler Inhale 2 puffs into the lungs every 6 (six) hours as needed for wheezing or shortness of  breath.   Yes [provider]  FLUoxetine (PROZAC) 20 MG tablet Take 20 mg by mouth 2 (two) times daily.   Yes [provider]  thyroid (ARMOUR) 60 MG tablet Take 60 mg by mouth 2 (two) times daily.   Yes [provider]  furosemide (LASIX) 40 MG tablet Take 40 mg by mouth daily.    [provider]  guaiFENesin-codeine 100-10 MG/5ML syrup Take 5 mLs by mouth every 6 (six) hours as needed for cough. 10/29/17   Coral Spikes, DO  SUMAtriptan (IMITREX) 50 MG tablet Take 1-2 tablets (50-100 mg total) by mouth once for 1 dose. May repeat in 2 hours if headache persists or recurs. 02/04/19 02/04/19  Coral Spikes, DO    Family History Family History  Problem Relation Age of Onset  . Hypertension Mother   . Hypertension Father     Social History Social History   Tobacco Use  . Smoking status: Former Smoker    Packs/day: 1.00    Types: Cigarettes    Last attempt to quit: 03/22/2004    Years since quitting: 14.8  . Smokeless tobacco: Never Used  Substance Use Topics  . Alcohol use: No  . Drug use: No     Allergies   Other and Dilaudid [hydromorphone]   Review of Systems Review of Systems  Eyes: Positive for photophobia.  Neurological:  Headache. Phonophobia.   Physical Exam Triage Vital Signs ED Triage Vitals  Enc Vitals Group     BP 02/04/19 0809 107/69     Pulse Rate 02/04/19 0809 76     Resp 02/04/19 0809 18     Temp 02/04/19 0809 98.7 F (37.1 C)     Temp Source 02/04/19 0809 Oral     SpO2 02/04/19 0809 98 %     Weight 02/04/19 0811 165 lb (74.8 kg)     Height 02/04/19 0811 5\' 9"  (1.753 m)     Head Circumference --      Peak Flow --      Pain Score 02/04/19 0811 8     Pain Loc --      Pain Edu? --      Excl. in [place]? --   Updated Vital Signs BP 107/69 (BP Location: Right Arm)   Pulse 76   Temp 98.7 F (37.1 C) (Oral)   Resp 18   Ht 5\' 9"  (1.753 m)   Wt 74.8 kg   SpO2 98%   BMI 24.37 kg/m   Visual Acuity Right Eye  Distance:   Left Eye Distance:   Bilateral Distance:    Right Eye Near:   Left Eye Near:    Bilateral Near:     Physical Exam Vitals signs and nursing note reviewed.  Constitutional:      General: She is not in acute distress.    Appearance: She is well-developed.  HENT:     Head: Normocephalic and atraumatic.     Nose: Nose normal.  Eyes:     Comments: Conjunctival injection bilaterally.  Cardiovascular:     Rate and Rhythm: Normal rate and regular rhythm.  Pulmonary:     Effort: Pulmonary effort is normal.     Breath sounds: Normal breath sounds.  Neurological:     General: No focal deficit present.     Mental Status: She is alert.  Psychiatric:        Mood and Affect: Mood normal.        Behavior: Behavior normal.    UC Treatments / Results  Labs (all labs ordered are listed, but only abnormal results are displayed) Labs Reviewed - No data to display  EKG None  Radiology No results found.  Procedures Procedures (including critical care time)  Medications Ordered in UC Medications  SUMAtriptan (IMITREX) injection 6 mg (6 mg Subcutaneous Given 02/04/19 0844)  promethazine (PHENERGAN) injection 25 mg (25 mg Intramuscular Given 02/04/19 0845)    Initial Impression / Assessment and Plan / UC Course  I have reviewed the triage vital signs and the nursing notes.  Pertinent labs & imaging results that were available during my care of the patient were reviewed by me and considered in my medical decision making (see chart for details).    38 year old female presents with acute migraine headache.  Treating with Imitrex and Phenergan today.  Sending home on Imitrex.  Final Clinical Impressions(s) / UC Diagnoses   Final diagnoses:  Intractable migraine without status migrainosus, unspecified migraine type   Discharge Instructions   None    ED Prescriptions    Medication Sig Dispense Auth. Provider   SUMAtriptan (IMITREX) 50 MG tablet Take 1-2 tablets (50-100  mg total) by mouth once for 1 dose. May repeat in 2 hours if headache persists or recurs. 20 tablet Coral Spikes, DO     Controlled Substance Prescriptions [place] Controlled Substance Registry consulted? Not Applicable  Coral Spikes, [place] 02/04/19 [phone]

## 2019-08-29 ENCOUNTER — Emergency Department
Admission: EM | Admit: 2019-08-29 | Discharge: 2019-08-29 | Disposition: A | Discharge status: Home / Self Care | Payer: Commercial Managed Care - PPO | Attending: Student in an Organized Health Care Education/Training Program | Admitting: Student in an Organized Health Care Education/Training Program

## 2019-08-29 ENCOUNTER — Other Ambulatory Visit: Payer: Self-pay

## 2019-08-29 DIAGNOSIS — E119 Type 2 diabetes mellitus without complications: Secondary | ICD-10-CM | POA: Insufficient documentation

## 2019-08-29 DIAGNOSIS — Y990 Civilian activity done for income or pay: Secondary | ICD-10-CM | POA: Insufficient documentation

## 2019-08-29 DIAGNOSIS — R631 Polydipsia: Secondary | ICD-10-CM | POA: Diagnosis not present

## 2019-08-29 DIAGNOSIS — Z87891 Personal history of nicotine dependence: Secondary | ICD-10-CM | POA: Insufficient documentation

## 2019-08-29 DIAGNOSIS — J45909 Unspecified asthma, uncomplicated: Secondary | ICD-10-CM | POA: Insufficient documentation

## 2019-08-29 DIAGNOSIS — Z79899 Other long term (current) drug therapy: Secondary | ICD-10-CM | POA: Insufficient documentation

## 2019-08-29 DIAGNOSIS — Z20828 Contact with and (suspected) exposure to other viral communicable diseases: Secondary | ICD-10-CM | POA: Insufficient documentation

## 2019-08-29 DIAGNOSIS — Y9389 Activity, other specified: Secondary | ICD-10-CM | POA: Insufficient documentation

## 2019-08-29 DIAGNOSIS — R55 Syncope and collapse: Secondary | ICD-10-CM | POA: Diagnosis not present

## 2019-08-29 DIAGNOSIS — Y9289 Other specified places as the place of occurrence of the external cause: Secondary | ICD-10-CM | POA: Insufficient documentation

## 2019-08-29 DIAGNOSIS — R519 Headache, unspecified: Secondary | ICD-10-CM | POA: Diagnosis not present

## 2019-08-29 DIAGNOSIS — Z733 Stress, not elsewhere classified: Secondary | ICD-10-CM | POA: Diagnosis not present

## 2019-08-29 DIAGNOSIS — Z8541 Personal history of malignant neoplasm of cervix uteri: Secondary | ICD-10-CM | POA: Diagnosis not present

## 2019-08-29 LAB — BASIC METABOLIC PANEL
Anion gap: 5 (ref 5–15)
BUN: 12 mg/dL (ref 6–20)
CO2: 24 mmol/L (ref 22–32)
Calcium: 9 mg/dL (ref 8.9–10.3)
Chloride: 110 mmol/L (ref 98–111)
Creatinine, Ser: 0.59 mg/dL (ref 0.44–1.00)
GFR calc Af Amer: >60 mL/min (ref >60)
GFR calc non Af Amer: >60 mL/min (ref >60)
Glucose, Bld: 109 mg/dL — ABNORMAL HIGH (ref 70–99)
Potassium: 4.2 mmol/L (ref 3.5–5.1)
Sodium: 139 mmol/L (ref 135–145)

## 2019-08-29 LAB — CBC
HCT: 35.9 % — ABNORMAL LOW (ref 36.0–46.0)
Hemoglobin: 11.8 g/dL — ABNORMAL LOW (ref 12.0–15.0)
MCH: 27.4 pg (ref 26.0–34.0)
MCHC: 32.9 g/dL (ref 30.0–36.0)
MCV: 83.3 fL (ref 80.0–100.0)
Platelets: 235 10*3/uL (ref 150–400)
RBC: 4.31 MIL/uL (ref 3.87–5.11)
RDW: 14.2 % (ref 11.5–15.5)
WBC: 5.4 10*3/uL (ref 4.0–10.5)
nRBC: 0 % (ref 0.0–0.2)

## 2019-08-29 LAB — URINALYSIS, COMPLETE (UACMP) WITH MICROSCOPIC
Bacteria, UA: NONE SEEN
Bilirubin Urine: NEGATIVE
Glucose, UA: NEGATIVE mg/dL
Hgb urine dipstick: NEGATIVE
Ketones, ur: 5 mg/dL — AB
Leukocytes,Ua: NEGATIVE
Nitrite: NEGATIVE
Protein, ur: 30 mg/dL — AB
Specific Gravity, Urine: 1.026 (ref 1.005–1.030)
pH: 6 (ref 5.0–8.0)

## 2019-08-29 LAB — GLUCOSE, CAPILLARY: Glucose-Capillary: 85 mg/dL (ref 70–99)

## 2019-08-29 LAB — POCT PREGNANCY, URINE: Preg Test, Ur: NEGATIVE

## 2019-08-29 MED ORDER — SODIUM CHLORIDE 0.9% FLUSH: 3.0000 mL | Freq: Once | INTRAVENOUS | Status: DC

## 2019-08-29 NOTE — ED Triage Notes (Signed)
Pt in via EMS from work with c/o syncopal episode today. Last episode was in 2013. Pt was caught by co-workers and helped to the ground. EMS reports pt with sore throat recently, T 99.4. BP 93/35

## 2019-08-29 NOTE — ED Notes (Signed)
See triage note  Presents s/p syncopal episode at work today  States she remembers asking for some water  Then waking up on the floor   States she feels better  Slight h/a

## 2019-08-29 NOTE — ED Provider Notes (Signed)
[facility] Emergency Department Provider Note    First MD Initiated Contact with Patient 08/29/19 1445     (approximate)  I have reviewed the triage vital signs and the nursing notes.   HISTORY  Chief Complaint Loss of Consciousness    HPI Sandra Sanders is a 38 y.o. 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  States she been standing in the line for several hours started feeling thirsty then started feeling lightheaded with blurry vision.  States that her vision started getting cloudy she started feeling weak said "oh shit" and then was helped to the ground by coworkers.  No reported seizure-like activity.  Did not bite her tongue.  Did not lose control of her bladder.  Has had fainting spell before.  Denied any chest pain or pressure.  Does still feel thirsty.  Admits to being significantly stressed out over the past several days that she is currently moving prior to moving into a new house.  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  She had not had lunch.  Denies any abdominal pain.   Past Medical History:  Diagnosis Date   Anemia    Anxiety    Arthritis    Asthma    Cancer ([place])    Cervical Cancer   Diabetes mellitus without complication (HCC)    GERD (gastroesophageal reflux disease)    Headache    Peripheral vascular disease (HCC)    ablation right and left veins in legs   PFO (patent foramen ovale)    as an infant   Thyroid disease    Family History  Problem Relation Age of Onset   Hypertension Mother    Hypertension Father    Past Surgical History:  Procedure Laterality Date   ABDOMINAL HYSTERECTOMY     CHOLECYSTECTOMY     KNEE SURGERY Left    LAPAROSCOPIC GASTRIC SLEEVE RESECTION N/A 04/22/2016   Procedure: LAPAROSCOPIC GASTRIC SLEEVE RESECTION;  Surgeon: Bonner Puna, MD;  Location:  [facility] ORS;  Service: General;  Laterality: N/A;   SEPTOPLASTY     Patient Active Problem List   Diagnosis Date Noted   Morbid obesity ([place]) 04/22/2016      Prior to Admission medications   Medication Sig Start Date End Date Taking? Authorizing Provider  topiramate (TOPAMAX) 25 MG tablet Take 25 mg by mouth 2 (two) times daily.   Yes [provider]  albuterol (PROVENTIL HFA;VENTOLIN HFA) 108 (90 Base) MCG/ACT inhaler Inhale 2 puffs into the lungs every 6 (six) hours as needed for wheezing or shortness of breath.    [provider]  FLUoxetine (PROZAC) 20 MG tablet Take 20 mg by mouth 2 (two) times daily.    [provider]  thyroid (ARMOUR) 60 MG tablet Take 60 mg by mouth 2 (two) times daily.    [provider]    Allergies Other and Dilaudid [hydromorphone]    Social History Social History   Tobacco Use   Smoking status: Former Smoker    Packs/day: 1.00    Types: Cigarettes    Quit date: 03/22/2004    Years since quitting: 15.4   Smokeless tobacco: Never Used  Substance Use Topics   Alcohol use: No   Drug use: No    Review of Systems Patient denies headaches, rhinorrhea, blurry vision, numbness, shortness  of breath, chest pain, edema, cough, abdominal pain, nausea, vomiting, diarrhea, dysuria, fevers, rashes or hallucinations unless otherwise stated above in HPI. ____________________________________________   PHYSICAL EXAM:  VITAL SIGNS: Vitals:   08/29/19 1330  BP: 107/61  Pulse: 61  Resp: 16  Temp: 98.3 F (36.8 C)  SpO2: 100%    Constitutional: Alert and oriented.  Eyes: Conjunctivae are normal.  Head: Atraumatic. Nose: No congestion/rhinnorhea. Mouth/Throat: Mucous membranes are moist.  No pta, exudates or erythema Neck: No stridor. Painless ROM.  Cardiovascular: Normal rate, regular rhythm. Grossly normal heart sounds.  Good peripheral circulation. Respiratory: Normal respiratory effort.  No retractions.  Lungs CTAB. Gastrointestinal: Soft and nontender. No distention. No abdominal bruits. No CVA tenderness. Genitourinary:  Musculoskeletal: No lower extremity tenderness nor edema.  No joint effusions. Neurologic:  CN- intact.  No facial droop, Normal FNF.  Normal heel to shin.  Sensation intact bilaterally. Normal speech and language. No gross focal neurologic deficits are appreciated. No gait instability. Skin:  Skin is warm, dry and intact. No rash noted. Psychiatric: Mood and affect are normal. Speech and behavior are normal.  ____________________________________________   LABS (all labs ordered are listed, but only abnormal results are displayed)  Results for orders placed or performed during the hospital encounter of 08/29/19 (from the past 24 hour(s))  Basic metabolic panel     Status: Abnormal   Collection Time: 08/29/19  1:42 PM  Result Value Ref Range   Sodium 139 135 - 145 mmol/L   Potassium 4.2 3.5 - 5.1 mmol/L   Chloride 110 98 - 111 mmol/L   CO2 24 22 - 32 mmol/L   Glucose, Bld 109 (H) 70 - 99 mg/dL   BUN 12 6 - 20 mg/dL   Creatinine, Ser 0.59 0.44 - 1.00 mg/dL   Calcium 9.0 8.9 - 10.3 mg/dL   GFR calc non Af Amer >60 >60 mL/min   GFR calc Af Amer >60 >60 mL/min   Anion gap 5 5 - 15  CBC     Status: Abnormal   Collection Time: 08/29/19  1:42 PM  Result Value Ref Range   WBC 5.4 4.0 - 10.5 K/uL   RBC 4.31 3.87 - 5.11 MIL/uL   Hemoglobin 11.8 (L) 12.0 - 15.0 g/dL   HCT 35.9 (L) 36.0 - 46.0 %   MCV 83.3 80.0 - 100.0 fL   MCH 27.4 26.0 - 34.0 pg   MCHC 32.9 30.0 - 36.0 g/dL   RDW 14.2 11.5 - 15.5 %   Platelets 235 150 - 400 K/uL   nRBC 0.0 0.0 - 0.2 %  Urinalysis, Complete w Microscopic     Status: Abnormal   Collection Time: 08/29/19  1:42 PM  Result Value Ref Range   Color, Urine YELLOW (A) YELLOW   APPearance HAZY (A) CLEAR   Specific Gravity, Urine 1.026 1.005 - 1.030   pH 6.0 5.0 - 8.0   Glucose, UA NEGATIVE NEGATIVE mg/dL   Hgb urine dipstick NEGATIVE  NEGATIVE   Bilirubin Urine NEGATIVE NEGATIVE   Ketones, ur 5 (A) NEGATIVE mg/dL   Protein, ur 30 (A) NEGATIVE mg/dL   Nitrite NEGATIVE NEGATIVE   Leukocytes,Ua NEGATIVE NEGATIVE   RBC / HPF 0-5 0 - 5 RBC/hpf   WBC, UA 0-5 0 - 5 WBC/hpf   Bacteria, UA NONE SEEN NONE SEEN   Squamous Epithelial / LPF 0-5 0 - 5   Mucus PRESENT   Glucose, capillary     Status: None   Collection Time:  08/29/19  1:47 PM  Result Value Ref Range   Glucose-Capillary 85 70 - 99 mg/dL  Pregnancy, urine POC     Status: None   Collection Time: 08/29/19  1:50 PM  Result Value Ref Range   Preg Test, Ur NEGATIVE NEGATIVE   ____________________________________________  EKG My review and personal interpretation at Time: 13:36   Indication: fainting  Rate: 60  Rhythm: sinus Axis: normal Other: normal intervals, no wpw or brugada ____________________________________________  RADIOLOGY   ____________________________________________   PROCEDURES  Procedure(s) performed:  Procedures    Critical Care performed: no ____________________________________________   INITIAL IMPRESSION / ASSESSMENT AND PLAN / ED COURSE  Pertinent labs & imaging results that were available during my care of the patient were reviewed by me and considered in my medical decision making (see chart for details).   DDX: Dehydration, orthostasis, vasovagal, anemia, ectopic, dysrhythmia, seizure  ERIANNY WAKEHAM is a 39 y.o. who presents to the ED with symptoms as described above.  Patient nontoxic-appearing no acute distress.  Reassuring exam.  Neuro evaluation is nonfocal.  Not consistent with seizure.  No indication for emergent neuroimaging.  Not have any hypoxia.  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  Does not seem consistent with pneumonia or hypoxia.  Has good breath sounds throughout.  High suspicion for dehydration possible  vasovagal episode.  Offered IV fluids.  Patient agreeable to trying oral hydration.  Seen indication for hospitalization at this time.     The patient was evaluated in Emergency Department today for the symptoms described in the history of present illness. He/she was evaluated in the context of the global COVID-19 pandemic, which necessitated consideration that the patient might be at risk for infection with the SARS-CoV-2 virus that causes COVID-19. Institutional protocols and algorithms that pertain to the evaluation of patients at risk for COVID-19 are in a state of rapid change based on information released by regulatory bodies including the CDC and federal and state organizations. These policies and algorithms were followed during the patient's care in the ED.  As part of my medical decision making, I reviewed the following data within the [place] notes reviewed and incorporated, Labs reviewed, notes from prior ED visits and [place] Controlled Substance Database   ____________________________________________   FINAL CLINICAL IMPRESSION(S) / ED DIAGNOSES  Final diagnoses:  Fainting spell      NEW MEDICATIONS STARTED DURING THIS VISIT:  New Prescriptions   No medications on file     Note:  This document was prepared using Dragon voice recognition software and may include unintentional dictation errors.    Merlyn Lot, MD 08/29/19 [phone]

## 2019-08-29 NOTE — ED Triage Notes (Signed)
Pt states she was working today at [employer] on the [employer] and had sudden onset LOC, cowrokers assisted the pt to the floor. Pt states she has had sinus drainage with a sore throat for the past 2 weeks. Denies pain at present.

## 2019-08-30 LAB — NOVEL CORONAVIRUS, NAA (HOSP ORDER, SEND-OUT TO REF LAB; TAT 18-24 HRS): SARS-CoV-2, NAA: NOT DETECTED

## 2019-09-23 DIAGNOSIS — U071 COVID-19: Secondary | ICD-10-CM

## 2019-09-23 HISTORY — DX: COVID-19: U07.1

## 2019-10-26 ENCOUNTER — Encounter: Payer: Self-pay | Admitting: Emergency Medicine

## 2019-10-26 ENCOUNTER — Emergency Department
Admission: EM | Admit: 2019-10-26 | Discharge: 2019-10-26 | Disposition: A | Discharge status: Home / Self Care | Payer: Commercial Managed Care - PPO | Attending: Emergency Medicine | Admitting: Emergency Medicine

## 2019-10-26 ENCOUNTER — Other Ambulatory Visit: Payer: Self-pay

## 2019-10-26 ENCOUNTER — Emergency Department: Payer: Commercial Managed Care - PPO

## 2019-10-26 DIAGNOSIS — Z79899 Other long term (current) drug therapy: Secondary | ICD-10-CM | POA: Insufficient documentation

## 2019-10-26 DIAGNOSIS — Z8616 Personal history of COVID-19: Secondary | ICD-10-CM | POA: Diagnosis not present

## 2019-10-26 DIAGNOSIS — E119 Type 2 diabetes mellitus without complications: Secondary | ICD-10-CM | POA: Insufficient documentation

## 2019-10-26 DIAGNOSIS — R0602 Shortness of breath: Secondary | ICD-10-CM | POA: Diagnosis not present

## 2019-10-26 DIAGNOSIS — J9801 Acute bronchospasm: Secondary | ICD-10-CM | POA: Insufficient documentation

## 2019-10-26 DIAGNOSIS — Z87891 Personal history of nicotine dependence: Secondary | ICD-10-CM | POA: Insufficient documentation

## 2019-10-26 LAB — CBC WITH DIFFERENTIAL/PLATELET
Abs Immature Granulocytes: 0.01 10*3/uL (ref 0.00–0.07)
Basophils Absolute: 0.1 10*3/uL (ref 0.0–0.1)
Basophils Relative: 1 %
Eosinophils Absolute: 0.1 10*3/uL (ref 0.0–0.5)
Eosinophils Relative: 3 %
HCT: 38.1 % (ref 36.0–46.0)
Hemoglobin: 12.4 g/dL (ref 12.0–15.0)
Immature Granulocytes: 0 %
Lymphocytes Relative: 27 %
Lymphs Abs: 1.4 10*3/uL (ref 0.7–4.0)
MCH: 27.9 pg (ref 26.0–34.0)
MCHC: 32.5 g/dL (ref 30.0–36.0)
MCV: 85.8 fL (ref 80.0–100.0)
Monocytes Absolute: 0.3 10*3/uL (ref 0.1–1.0)
Monocytes Relative: 6 %
Neutro Abs: 3.1 10*3/uL (ref 1.7–7.7)
Neutrophils Relative %: 63 %
Platelets: 277 10*3/uL (ref 150–400)
RBC: 4.44 MIL/uL (ref 3.87–5.11)
RDW: 13.4 % (ref 11.5–15.5)
WBC: 5 10*3/uL (ref 4.0–10.5)
nRBC: 0 % (ref 0.0–0.2)

## 2019-10-26 LAB — COMPREHENSIVE METABOLIC PANEL
ALT: 25 U/L (ref 0–44)
AST: 28 U/L (ref 15–41)
Albumin: 4.3 g/dL (ref 3.5–5.0)
Alkaline Phosphatase: 78 U/L (ref 38–126)
Anion gap: 8 (ref 5–15)
BUN: 8 mg/dL (ref 6–20)
CO2: 24 mmol/L (ref 22–32)
Calcium: 9.5 mg/dL (ref 8.9–10.3)
Chloride: 111 mmol/L (ref 98–111)
Creatinine, Ser: 0.44 mg/dL (ref 0.44–1.00)
GFR calc Af Amer: >60 mL/min (ref >60)
GFR calc non Af Amer: >60 mL/min (ref >60)
Glucose, Bld: 94 mg/dL (ref 70–99)
Potassium: 4.2 mmol/L (ref 3.5–5.1)
Sodium: 143 mmol/L (ref 135–145)
Total Bilirubin: 0.6 mg/dL (ref 0.3–1.2)
Total Protein: 6.7 g/dL (ref 6.5–8.1)

## 2019-10-26 LAB — LACTIC ACID, PLASMA: Lactic Acid, Venous: 1.1 mmol/L (ref 0.5–1.9)

## 2019-10-26 MED ORDER — MAGNESIUM SULFATE 2 GM/50ML IV SOLN
2.0000 g | Freq: Once | INTRAVENOUS | Status: AC
  Administered 2019-10-26: 13:00:00 2 g via INTRAVENOUS
  Filled 2019-10-26: qty 50

## 2019-10-26 MED ORDER — SODIUM CHLORIDE 0.9% FLUSH
3.0000 mL | Freq: Once | INTRAVENOUS | Status: AC
  Administered 2019-10-26: 13:00:00 3 mL via INTRAVENOUS

## 2019-10-26 MED ORDER — PREDNISONE 50 MG PO TABS: 50.0000 mg | ORAL_TABLET | Freq: Every day | ORAL | 0 refills | Status: DC

## 2019-10-26 MED ORDER — METHYLPREDNISOLONE SODIUM SUCC 125 MG IJ SOLR
125.0000 mg | Freq: Once | INTRAMUSCULAR | Status: AC
  Administered 2019-10-26: 125 mg via INTRAVENOUS
  Filled 2019-10-26: qty 2

## 2019-10-26 MED ORDER — IOHEXOL 350 MG/ML SOLN
75.0000 mL | Freq: Once | INTRAVENOUS | Status: AC | PRN
  Administered 2019-10-26: 75 mL via INTRAVENOUS

## 2019-10-26 MED ORDER — IPRATROPIUM-ALBUTEROL 0.5-2.5 (3) MG/3ML IN SOLN
3.0000 mL | Freq: Once | RESPIRATORY_TRACT | Status: AC
  Administered 2019-10-26: 3 mL via RESPIRATORY_TRACT

## 2019-10-26 MED ORDER — IPRATROPIUM-ALBUTEROL 0.5-2.5 (3) MG/3ML IN SOLN
3.0000 mL | Freq: Once | RESPIRATORY_TRACT | Status: AC
  Administered 2019-10-26: 3 mL via RESPIRATORY_TRACT
  Filled 2019-10-26: qty 6

## 2019-10-26 MED ORDER — IPRATROPIUM-ALBUTEROL 0.5-2.5 (3) MG/3ML IN SOLN
3.0000 mL | Freq: Once | RESPIRATORY_TRACT | Status: AC
  Administered 2019-10-26: 3 mL via RESPIRATORY_TRACT
  Filled 2019-10-26: qty 3

## 2019-10-26 NOTE — ED Notes (Signed)
Pt transported to CT at this time.

## 2019-10-26 NOTE — ED Notes (Signed)
NAD noted at time of D/C. Pt denies questions or concerns. Pt ambulatory to the lobby at this time.  

## 2019-10-26 NOTE — ED Provider Notes (Signed)
[facility] Emergency Department Provider Note   ____________________________________________    I have reviewed the triage vital signs and the nursing notes.   HISTORY  Chief Complaint Shortness of Breath     HPI Sandra Sanders is a 39 y.o. female with a history of diabetes, asthma who recently recovered from COVID-19 presents with complaints of shortness of breath and pleurisy.  Patient reports with ambulation or with talking she becomes winded.  She describes pain in her left anterior chest and some pain in her left upper back.  She denies fevers or chills.  No significant cough.  Has been using her inhaler frequently, she did not used to have to use it before Covid.  No history of blood clots.  Feels "okay "at rest.  Past Medical History:  Diagnosis Date  . Anemia   . Anxiety   . Arthritis   . Asthma   . Cancer (HCC)    Cervical Cancer  . Diabetes mellitus without complication ([place])   . GERD (gastroesophageal reflux disease)   . Headache   . Peripheral vascular disease ([place])    ablation right and left veins in legs  . PFO (patent foramen ovale)    as an infant  . Thyroid disease     Patient Active Problem List   Diagnosis Date Noted  . Morbid obesity ([place]) 04/22/2016    Past Surgical History:  Procedure Laterality Date  . ABDOMINAL HYSTERECTOMY    . CHOLECYSTECTOMY    . KNEE SURGERY Left   . LAPAROSCOPIC GASTRIC SLEEVE RESECTION N/A 04/22/2016   Procedure: LAPAROSCOPIC GASTRIC SLEEVE RESECTION;  Surgeon: Bonner Puna, MD;  Location: [facility] ORS;  Service: General;  Laterality: N/A;  . SEPTOPLASTY      Prior to Admission medications   Medication Sig Start Date End Date Taking? Authorizing Provider  albuterol (PROVENTIL HFA;VENTOLIN HFA) 108 (90 Base) MCG/ACT inhaler Inhale 2 puffs into the lungs every 6 (six) hours as needed for wheezing or shortness of breath.    [provider]  FLUoxetine (PROZAC) 20 MG tablet Take 20 mg  by mouth 2 (two) times daily.    [provider]  predniSONE (DELTASONE) 50 MG tablet Take 1 tablet (50 mg total) by mouth daily with breakfast. 10/26/19   Lavonia Drafts, MD  thyroid (ARMOUR) 60 MG tablet Take 60 mg by mouth 2 (two) times daily.    [provider]  topiramate (TOPAMAX) 25 MG tablet Take 25 mg by mouth 2 (two) times daily.    [provider]     Allergies Other and Dilaudid [hydromorphone]  Family History  Problem Relation Age of Onset  . Hypertension Mother   . Hypertension Father     Social History Social History   Tobacco Use  . Smoking status: Former Smoker    Packs/day: 1.00    Types: Cigarettes    Quit date: 03/22/2004    Years since quitting: 15.6  . Smokeless tobacco: Never Used  Substance Use Topics  . Alcohol use: No  . Drug use: No    Review of Systems  Constitutional: No fever/chills Eyes: No visual changes.  ENT: No sore throat. Cardiovascular: As above Respiratory: As above Gastrointestinal: No abdominal pain.  No nausea, no vomiting.   Genitourinary: Negative for dysuria. Musculoskeletal: Negative for back pain. Skin: Negative for rash. Neurological: Negative for headaches   ____________________________________________   PHYSICAL EXAM:  VITAL SIGNS: ED Triage Vitals  Enc Vitals Group  BP 10/26/19 1028 107/78     Pulse Rate 10/26/19 1028 77     Resp 10/26/19 1028 18     Temp 10/26/19 1215 98.2 F (36.8 C)     Temp Source 10/26/19 1028 Oral     SpO2 10/26/19 1028 92 %     Weight 10/26/19 1024 69.9 kg (154 lb 1.6 oz)     Height 10/26/19 1024 1.753 m (5\' 9" )     Head Circumference --      Peak Flow --      Pain Score 10/26/19 1024 0     Pain Loc --      Pain Edu? --      Excl. in [place]? --     Constitutional: Alert and oriented. Eyes: Conjunctivae are normal.   Nose: No congestion/rhinnorhea. Mouth/Throat: Mucous membranes are moist.  Hoarse voice, pharynx normal  Cardiovascular: Normal rate,  regular rhythm. Grossly normal heart sounds.  Good peripheral circulation. Respiratory: Normal respiratory effort.  No retractions.  No significant wheezing.  Patient does become labored with conversation Gastrointestinal: Soft and nontender. No distention.  No CVA tenderness.  Musculoskeletal: Warm and well perfused Neurologic:  Normal speech and language. No gross focal neurologic deficits are appreciated.  Skin:  Skin is warm, dry and intact. No rash noted. Psychiatric: Mood and affect are normal. Speech and behavior are normal.  ____________________________________________   LABS (all labs ordered are listed, but only abnormal results are displayed)  Labs Reviewed  LACTIC ACID, PLASMA  COMPREHENSIVE METABOLIC PANEL  CBC WITH DIFFERENTIAL/PLATELET  URINALYSIS, COMPLETE (UACMP) WITH MICROSCOPIC   ____________________________________________  EKG  None ____________________________________________  RADIOLOGY  Chest x-ray unremarkable ____________________________________________   PROCEDURES  Procedure(s) performed: No  Procedures   Critical Care performed: No ____________________________________________   INITIAL IMPRESSION / ASSESSMENT AND PLAN / ED COURSE  Pertinent labs & imaging results that were available during my care of the patient were reviewed by me and considered in my medical decision making (see chart for details).  Patient presents with shortness of breath with exertion, pleurisy, recent recovery from COVID-19.  Question bronchospasm/bronchitis versus possible PE.  Will treat with IV Solu-Medrol, IV magnesium, DuoNeb sent for CT angiography to rule out PE.  CT angiography negative for PE, patient is feeling much better after treatment, will give additional nebulizer and discharged home with prednisone burst, close outpatient follow-up, return precautions discussed    ____________________________________________   FINAL CLINICAL IMPRESSION(S) / ED  DIAGNOSES  Final diagnoses:  Shortness of breath  Bronchospasm        Note:  This document was prepared using Dragon voice recognition software and may include unintentional dictation errors.   Lavonia Drafts, MD 10/26/19 [phone]

## 2019-10-26 NOTE — ED Notes (Signed)
Rainbow, plus grey and SST sent to lab

## 2019-10-26 NOTE — ED Notes (Signed)
Pt currently doing 3rd duoneb treatment at this time, pt to be discharged after last duoneb treatment.

## 2019-10-26 NOTE — ED Notes (Signed)
EDP at bedside at this time.  

## 2019-10-26 NOTE — ED Triage Notes (Signed)
C+ 2 weeks ago.  Arrives today c/o worsening SOB, pain with inspiration, laryngitis x 3 days, and fatigue.  States last week coughing productively.

## 2020-06-27 ENCOUNTER — Other Ambulatory Visit: Payer: Self-pay

## 2020-06-27 ENCOUNTER — Inpatient Hospital Stay: Payer: Commercial Managed Care - PPO | Attending: Obstetrics and Gynecology | Admitting: Obstetrics and Gynecology

## 2020-06-27 VITALS — BP 99/68 | HR 60 | Temp 98.0°F | Resp 20 | Wt 152.9 lb

## 2020-06-27 DIAGNOSIS — Z90710 Acquired absence of both cervix and uterus: Secondary | ICD-10-CM | POA: Diagnosis not present

## 2020-06-27 DIAGNOSIS — Z7952 Long term (current) use of systemic steroids: Secondary | ICD-10-CM | POA: Diagnosis not present

## 2020-06-27 DIAGNOSIS — K219 Gastro-esophageal reflux disease without esophagitis: Secondary | ICD-10-CM | POA: Diagnosis not present

## 2020-06-27 DIAGNOSIS — J45909 Unspecified asthma, uncomplicated: Secondary | ICD-10-CM | POA: Insufficient documentation

## 2020-06-27 DIAGNOSIS — Q211 Atrial septal defect: Secondary | ICD-10-CM | POA: Diagnosis not present

## 2020-06-27 DIAGNOSIS — Z6836 Body mass index (BMI) 36.0-36.9, adult: Secondary | ICD-10-CM | POA: Insufficient documentation

## 2020-06-27 DIAGNOSIS — R87622 Low grade squamous intraepithelial lesion on cytologic smear of vagina (LGSIL): Secondary | ICD-10-CM

## 2020-06-27 DIAGNOSIS — E1151 Type 2 diabetes mellitus with diabetic peripheral angiopathy without gangrene: Secondary | ICD-10-CM | POA: Diagnosis not present

## 2020-06-27 DIAGNOSIS — R87612 Low grade squamous intraepithelial lesion on cytologic smear of cervix (LGSIL): Secondary | ICD-10-CM | POA: Diagnosis not present

## 2020-06-27 DIAGNOSIS — Z79899 Other long term (current) drug therapy: Secondary | ICD-10-CM | POA: Insufficient documentation

## 2020-06-27 DIAGNOSIS — F419 Anxiety disorder, unspecified: Secondary | ICD-10-CM | POA: Diagnosis not present

## 2020-06-27 DIAGNOSIS — Z87891 Personal history of nicotine dependence: Secondary | ICD-10-CM | POA: Diagnosis not present

## 2020-06-27 DIAGNOSIS — M1990 Unspecified osteoarthritis, unspecified site: Secondary | ICD-10-CM | POA: Diagnosis not present

## 2020-06-27 NOTE — Patient Instructions (Signed)
Please schedule follow up with Dr. Leafy Ro in approximately 6 months (around April 2022).

## 2020-06-27 NOTE — Progress Notes (Signed)
Gynecologic Oncology Consult Visit   Referring Provider: Dr. 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 female who is seen in consultation from Dr. 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   She had LGSIL on vaginal pap smear on 04/2020. Colposcopy by Dr Leafy Ro 05/25/20 was negative.   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 01/2016  ASCUS/neg 09/04/2017 NILM/neg 07/2018  ASCUS/neg 04/2020  LSIL PAP  She is not symptomatic.  No discharge or bleeding.   Problem List: Patient Active Problem List   Diagnosis Date Noted   Morbid obesity ([place]) 04/22/2016    Past Medical History: Past Medical History:  Diagnosis Date   Anemia    Anxiety    Arthritis    Asthma    Cancer ([place])    Cervical Cancer   Diabetes mellitus without complication (HCC)    GERD (gastroesophageal reflux disease)    Headache    Peripheral vascular disease (HCC)    ablation right and left veins in legs   PFO (patent foramen ovale)    as an infant   Thyroid disease     Past Surgical History: Past Surgical History:  Procedure Laterality Date   ABDOMINAL HYSTERECTOMY     CHOLECYSTECTOMY     KNEE SURGERY Left    LAPAROSCOPIC GASTRIC SLEEVE RESECTION N/A 04/22/2016   Procedure: LAPAROSCOPIC GASTRIC SLEEVE RESECTION;  Surgeon: Bonner Puna, MD;  Location: [facility] ORS;  Service: General;  Laterality: N/A;   SEPTOPLASTY      Past Gynecologic History:  Menarche: age 70 Sexually active  Hx of abnormal pap smear Hx of hysterectomy   OB History:  G2P2  Family History: Family History  Problem Relation Age of Onset   Hypertension Mother    Hypertension Father    Cancer Other     Social History: Social History    Socioeconomic History   Marital status: Divorced    Spouse name: Not on file   Number of children: Not on file   Years of education: Not on file   Highest education level: Not on file  Occupational History   Not on file  Tobacco Use   Smoking status: Former Smoker    Packs/day: 1.00    Types: Cigarettes    Quit date: 03/22/2004    Years since quitting: 16.2   Smokeless tobacco: Never Used  Vaping Use   Vaping Use: Never used  Substance and Sexual Activity   Alcohol use: No   Drug use: No   Sexual activity: Yes    Birth control/protection: Surgical  Other Topics Concern   Not on file  Social History Narrative   Not on file   Social Determinants of Health   Financial Resource Strain:    Difficulty of Paying Living Expenses: Not on file  Food Insecurity:    Worried About Running Out of Food in the Last Year: Not on file   [employer] of Food in the Last Year: Not on file  Transportation Needs:    Lack of Transportation (Medical): Not on file   Lack of Transportation (Non-Medical): Not on file  Physical Activity:    Days of Exercise per Week: Not on file  Minutes of Exercise per Session: Not on file  Stress:    Feeling of Stress : Not on file  Social Connections:    Frequency of Communication with Friends and Family: Not on file   Frequency of Social Gatherings with Friends and Family: Not on file   Attends Religious Services: Not on file   Active Member of Clubs or Organizations: Not on file   Attends Archivist Meetings: Not on file   Marital Status: Not on file  Intimate Partner Violence:    Fear of Current or Ex-Partner: Not on file   Emotionally Abused: Not on file   Physically Abused: Not on file   Sexually Abused: Not on file    Allergies: Allergies  Allergen Reactions   Hydromorphone Hives and Itching   Other Anaphylaxis    Carrots, bananas, and watermelon. per pt   Hydrocodone-Acetaminophen Hives    Really  bad itching all over   Banana Itching   Citrullus Vulgaris Itching   Daucus Carota Itching   Lidocaine     PT REPORTS  " DID NOT WORK "    Miconazole Other (See Comments)    SKIN BLISTERS   Tioconazole Rash    Current Medications: Current Outpatient Medications  Medication Sig Dispense Refill   albuterol (PROVENTIL HFA;VENTOLIN HFA) 108 (90 Base) MCG/ACT inhaler Inhale 2 puffs into the lungs every 6 (six) hours as needed for wheezing or shortness of breath.     FLUoxetine (PROZAC) 20 MG tablet Take 20 mg by mouth 2 (two) times daily.     thyroid (ARMOUR) 60 MG tablet Take 60 mg by mouth 2 (two) times daily.     topiramate (TOPAMAX) 25 MG tablet Take 25 mg by mouth 2 (two) times daily.     predniSONE (DELTASONE) 50 MG tablet Take 1 tablet (50 mg total) by mouth daily with breakfast. (Patient not taking: Reported on 06/27/2020) 5 tablet 0   No current facility-administered medications for this visit.   General: negative for fevers, changes in weight or night sweats Skin: negative for changes in moles or sores or rash Eyes: negative for changes in vision HEENT: negative for change in hearing, tinnitus, voice changes Pulmonary: negative for dyspnea, orthopnea, productive cough, wheezing Cardiac: negative for palpitations, pain Gastrointestinal: negative for nausea, vomiting, constipation, diarrhea, hematemesis, hematochezia Genitourinary/Sexual: negative for dysuria, retention, hematuria, incontinence Ob/Gyn:  negative for abnormal bleeding, or pain Musculoskeletal: negative for pain, joint pain, back pain Hematology: negative for easy bruising, abnormal bleeding Neurologic/Psych: negative for headaches, seizures, paralysis, weakness, numbness  Objective:  Physical Examination:  BP 99/68    Pulse 60    Temp 98 F (36.7 C)    Resp 20    Wt 152 lb 14.4 oz (69.4 kg)    SpO2 100%    BMI 22.58 kg/m     ECOG Performance Status: 0 - Asymptomatic  GENERAL: Patient is a well  appearing female in no acute distress NODES:  No cervical, supraclavicular, axillary, or inguinal lymphadenopathy palpated.  LUNGS:  Clear to auscultation bilaterally.  No wheezes or rhonchi. HEART:  Regular rate and rhythm. No murmur appreciated. ABDOMEN:  Soft, nontender.  Positive, normoactive bowel sounds.  MSK:  No focal spinal tenderness to palpation. Full range of motion bilaterally in the upper extremities. EXTREMITIES:  No peripheral edema.   SKIN:  Clear with no obvious rashes or skin changes.  NEURO:  Nonfocal. Well oriented.  Appropriate affect.  Pelvic: Exam chaperoned by Nursing EGBUS: no lesions  Vagina: no lesions, no discharge or bleeding Adnexa: no palpable masses  Procedure note: After informed consent and time out vaginal colposcopy was performed with acetic acid.  No lesions seen.   Lab Review Labs on site today: No labs on site today  Radiologic Imaging:  No imaging on site today    Assessment:  Sandra Sanders is a 39 y.o. P2 female diagnosed with LSIL PAP.  Had LEEP in 2003 for HSIL and then hysterectomy, LSO in 2004 for endometrial polyp.  No symptoms now and she is not a smoker or immunocompromised.  Normal colposcopy today.    Medical co-morbidities complicating care: asthma, anxiety, thyroid disease.  Plan:   Problem List Items Addressed This Visit    None    Visit Diagnoses    LGSIL Pap smear of vagina    -  Primary     We discussed options for management including continued surveillance with PAP smears, since no visible lesions seen on colposopcy.  Most LSIL PAPs following hysterectomy never require any treatment and will resolve spontaneously.    Suggested that she follow up with Dr Leafy Ro in 6 months for follow up exam and PAP.   The patient's diagnosis, an outline of the further diagnostic and laboratory studies which will be required, the recommendation for surgery, and alternatives were discussed with her and her accompanying family members.   All questions were answered to their satisfaction.  A total of 40 minutes were spent with the patient/family today; 60% was spent in education, counseling and coordination of care for LSIL PAP of vagina.    Verlon Au, NP  I personally interviewed and examined the patient. Agreed with the above/below plan of care. I have directly contributed to assessment and plan of care of this patient and educated and discussed with patient and family.  Mellody Drown, MD  CC:  [address], [address],  [place] [address] [phone]

## 2020-07-02 ENCOUNTER — Encounter: Payer: Self-pay | Admitting: Obstetrics and Gynecology

## 2020-08-20 ENCOUNTER — Ambulatory Visit
Admission: EM | Admit: 2020-08-20 | Discharge: 2020-08-20 | Disposition: A | Discharge status: Home / Self Care | Payer: Commercial Managed Care - PPO | Attending: Emergency Medicine | Admitting: Emergency Medicine

## 2020-08-20 ENCOUNTER — Encounter: Payer: Self-pay | Admitting: Emergency Medicine

## 2020-08-20 ENCOUNTER — Other Ambulatory Visit: Payer: Self-pay

## 2020-08-20 DIAGNOSIS — B349 Viral infection, unspecified: Secondary | ICD-10-CM | POA: Diagnosis not present

## 2020-08-20 DIAGNOSIS — Z1152 Encounter for screening for COVID-19: Secondary | ICD-10-CM | POA: Diagnosis not present

## 2020-08-20 DIAGNOSIS — R509 Fever, unspecified: Secondary | ICD-10-CM

## 2020-08-20 MED ORDER — IBUPROFEN 600 MG PO TABS
600.0000 mg | ORAL_TABLET | Freq: Once | ORAL | Status: AC
  Administered 2020-08-20: 600 mg via ORAL

## 2020-08-20 NOTE — ED Provider Notes (Signed)
Roderic Palau    CSN: [id number] Arrival date & time: 08/20/20  1357      History   Chief Complaint Chief Complaint  Patient presents with  . Generalized Body Aches  . Headache    HPI Sandra Sanders is a 39 y.o. female.   Patient presents with fever, body aches, headache since this morning.  She has taken Tylenol for her symptoms.  She denies rash, sore throat, cough, shortness of breath, vomiting, diarrhea, or other symptoms.  She states she has family members who have tested positive for RSV and COVID.  Her medical history includes diabetes, asthma, GERD, arthritis, anemia, PVD, cervical cancer, headache, anxiety, thyroid disease.  The history is provided by the patient and medical records.    Past Medical History:  Diagnosis Date  . Anemia   . Anxiety   . Arthritis   . Asthma   . Cancer (HCC)    Cervical Cancer  . Diabetes mellitus without complication ([place])   . GERD (gastroesophageal reflux disease)   . Headache   . Peripheral vascular disease ([place])    ablation right and left veins in legs  . PFO (patent foramen ovale)    as an infant  . Thyroid disease     Patient Active Problem List   Diagnosis Date Noted  . Morbid obesity ([place]) 04/22/2016    Past Surgical History:  Procedure Laterality Date  . ABDOMINAL HYSTERECTOMY    . CHOLECYSTECTOMY    . KNEE SURGERY Left   . LAPAROSCOPIC GASTRIC SLEEVE RESECTION N/A 04/22/2016   Procedure: LAPAROSCOPIC GASTRIC SLEEVE RESECTION;  Surgeon: Bonner Puna, MD;  Location: [facility] ORS;  Service: General;  Laterality: N/A;  . SEPTOPLASTY      OB History   No obstetric history on file.      Home Medications    Prior to Admission medications   Medication Sig Start Date End Date Taking? Authorizing Provider  acetaminophen (TYLENOL) 500 MG tablet Take 1,000 mg by mouth every 6 (six) hours as needed.   Yes [provider]  FLUoxetine (PROZAC) 20 MG tablet Take 20 mg by mouth 2 (two) times daily.   Yes  [provider]  thyroid (ARMOUR) 60 MG tablet Take 60 mg by mouth 2 (two) times daily.   Yes [provider]  topiramate (TOPAMAX) 25 MG tablet Take 25 mg by mouth 2 (two) times daily.   Yes [provider]  albuterol (PROVENTIL HFA;VENTOLIN HFA) 108 (90 Base) MCG/ACT inhaler Inhale 2 puffs into the lungs every 6 (six) hours as needed for wheezing or shortness of breath.    [provider]  predniSONE (DELTASONE) 50 MG tablet Take 1 tablet (50 mg total) by mouth daily with breakfast. Patient not taking: Reported on 06/27/2020 10/26/19   Lavonia Drafts, MD    Family History Family History  Problem Relation Age of Onset  . Hypertension Mother   . Hypertension Father   . Cancer Other     Social History Social History   Tobacco Use  . Smoking status: Former Smoker    Packs/day: 1.00    Types: Cigarettes    Quit date: 03/22/2004    Years since quitting: 16.4  . Smokeless tobacco: Never Used  Vaping Use  . Vaping Use: Never used  Substance Use Topics  . Alcohol use: No  . Drug use: No     Allergies   Hydromorphone, Other, Hydrocodone-acetaminophen, Banana, Citrullus vulgaris, Daucus carota, Lidocaine, Miconazole, and Tioconazole  Review of Systems Review of Systems  Constitutional: Positive for fever. Negative for chills.  HENT: Negative for ear pain and sore throat.   Eyes: Negative for pain and visual disturbance.  Respiratory: Negative for cough and shortness of breath.   Cardiovascular: Negative for chest pain and palpitations.  Gastrointestinal: Negative for abdominal pain, diarrhea and vomiting.  Genitourinary: Negative for dysuria and hematuria.  Musculoskeletal: Negative for arthralgias and back pain.  Skin: Negative for color change and rash.  Neurological: Positive for headaches. Negative for dizziness, seizures, syncope, facial asymmetry, speech difficulty, weakness and numbness.  All other systems reviewed and are  negative.    Physical Exam Triage Vital Signs ED Triage Vitals  Enc Vitals Group     BP      Pulse      Resp      Temp      Temp src      SpO2      Weight      Height      Head Circumference      Peak Flow      Pain Score      Pain Loc      Pain Edu?      Excl. in [place]?    No data found.  Updated Vital Signs BP 115/77 (BP Location: Left Arm)   Pulse (!) 112   Temp (!) 102.6 F (39.2 C) (Oral)   Resp 18   SpO2 95%   Visual Acuity Right Eye Distance:   Left Eye Distance:   Bilateral Distance:    Right Eye Near:   Left Eye Near:    Bilateral Near:     Physical Exam Vitals and nursing note reviewed.  Constitutional:      General: She is not in acute distress.    Appearance: She is well-developed. She is ill-appearing.  HENT:     Head: Normocephalic and atraumatic.     Right Ear: Tympanic membrane normal.     Left Ear: Tympanic membrane normal.     Nose: Congestion and rhinorrhea present.     Mouth/Throat:     Mouth: Mucous membranes are moist.     Pharynx: Posterior oropharyngeal erythema present.  Eyes:     Conjunctiva/sclera: Conjunctivae normal.  Cardiovascular:     Rate and Rhythm: Regular rhythm. Tachycardia present.     Heart sounds: Normal heart sounds.  Pulmonary:     Effort: Pulmonary effort is normal. No respiratory distress.     Breath sounds: Normal breath sounds. No wheezing or rhonchi.  Abdominal:     Palpations: Abdomen is soft.     Tenderness: There is no abdominal tenderness. There is no guarding or rebound.  Musculoskeletal:     Cervical back: Neck supple.  Skin:    General: Skin is warm and dry.     Findings: No rash.  Neurological:     General: No focal deficit present.     Mental Status: She is alert and oriented to person, place, and time.     Gait: Gait normal.  Psychiatric:        Mood and Affect: Mood normal.        Behavior: Behavior normal.      UC Treatments / Results  Labs (all labs ordered are listed, but only  abnormal results are displayed) Labs Reviewed  COVID-19, FLU A+B AND RSV    EKG   Radiology No results found.  Procedures Procedures (including critical care time)  Medications Ordered  in UC Medications  ibuprofen (ADVIL) tablet 600 mg (600 mg Oral Given 08/20/20 1524)    Initial Impression / Assessment and Plan / UC Course  I have reviewed the triage vital signs and the nursing notes.  Pertinent labs & imaging results that were available during my care of the patient were reviewed by me and considered in my medical decision making (see chart for details).   Viral illness, Fever.  Influenza, RSV, COVID pending.  Instructed patient to self quarantine until the test results are back.  Discussed symptomatic treatment including Tylenol or ibuprofen, rest, hydration.  Instructed patient to follow up with PCP if her symptoms are not improving  Patient agrees to plan of care.    Final Clinical Impressions(s) / UC Diagnoses   Final diagnoses:  Encounter for screening for COVID-19  Viral illness  Fever, unspecified     Discharge Instructions     Your COVID and Flu tests are pending.  You should self quarantine until the test results are back.    Take Tylenol or ibuprofen as needed for fever or discomfort.  Rest and keep yourself hydrated.    Follow-up with your primary care provider if your symptoms are not improving.        ED Prescriptions    None     PDMP not reviewed this encounter.   Sharion Balloon, NP 08/20/20 1538

## 2020-08-20 NOTE — ED Triage Notes (Addendum)
Patient c/o headache, body aches, and fever this morning.   Patient stated " 2 family member have tested positive for RSV and another family member tested positive for COVID-19."  Patient has taken Tylenol (1000mg  at 1230 today) and "migarine medicine" w/ no relief of symptoms.    Patient endorses fever of 101F(self reported temp from home).   Patient stated she had an episode of "electrical pulses going all over body this morning at work" and "my head has got this tick to it".    History of Migraines.

## 2020-08-20 NOTE — Discharge Instructions (Signed)
Your COVID and Flu tests are pending.  You should self quarantine until the test results are back.    Take Tylenol or ibuprofen as needed for fever or discomfort.  Rest and keep yourself hydrated.    Follow-up with your primary care provider if your symptoms are not improving.     

## 2020-08-21 LAB — COVID-19, FLU A+B AND RSV
Influenza A, NAA: NOT DETECTED
Influenza B, NAA: NOT DETECTED
RSV, NAA: NOT DETECTED
SARS-CoV-2, NAA: NOT DETECTED

## 2020-11-01 ENCOUNTER — Ambulatory Visit
Admission: EM | Admit: 2020-11-01 | Discharge: 2020-11-01 | Disposition: A | Discharge status: Home / Self Care | Payer: Commercial Managed Care - PPO

## 2020-11-01 ENCOUNTER — Other Ambulatory Visit: Payer: Self-pay

## 2020-11-01 ENCOUNTER — Encounter: Payer: Self-pay | Admitting: Emergency Medicine

## 2020-11-01 DIAGNOSIS — G43711 Chronic migraine without aura, intractable, with status migrainosus: Secondary | ICD-10-CM

## 2020-11-01 MED ORDER — DEXAMETHASONE SODIUM PHOSPHATE 10 MG/ML IJ SOLN
10.0000 mg | Freq: Once | INTRAMUSCULAR | Status: AC
  Administered 2020-11-01: 10 mg via INTRAMUSCULAR

## 2020-11-01 MED ORDER — KETOROLAC TROMETHAMINE 30 MG/ML IJ SOLN
30.0000 mg | Freq: Once | INTRAMUSCULAR | Status: AC
  Administered 2020-11-01: 30 mg via INTRAMUSCULAR

## 2020-11-01 MED ORDER — ONDANSETRON 4 MG PO TBDP
4.0000 mg | ORAL_TABLET | Freq: Once | ORAL | Status: AC
  Administered 2020-11-01: 4 mg via ORAL

## 2020-11-01 NOTE — Discharge Instructions (Addendum)
We gave you a migraine cocktail here today.  Rest, hydrate.  Prescribed medicines as needed.  Follow up as needed for continued or worsening symptoms

## 2020-11-01 NOTE — ED Triage Notes (Signed)
Pt presents with migraine x 3 days. +nausea. She has taken Topamax and Imitrex today with no relief. Pain 10/10.

## 2020-11-02 NOTE — ED Provider Notes (Signed)
Roderic Palau    CSN: [id number] Arrival date & time: 11/01/20  1417      History   Chief Complaint Chief Complaint  Patient presents with  . Migraine    HPI [name] is a 40 y.o. female.   Patient is a 40 year old female presents today for migraine.  This is been constant for the past 3 days.  She has some associated nausea and photophobia.  History of chronic migraines.  Took Topamax and Imitrex today with no relief.  Pain to 10/10.  No dizziness, lightheadedness, blurred vision.     Past Medical History:  Diagnosis Date  . Anemia   . Anxiety   . Arthritis   . Asthma   . Cancer (HCC)    Cervical Cancer  . COVID-19 09/2019  . Diabetes mellitus without complication ([place])   . GERD (gastroesophageal reflux disease)   . Headache   . Peripheral vascular disease ([place])    ablation right and left veins in legs  . PFO (patent foramen ovale)    as an infant  . Thyroid disease     Patient Active Problem List   Diagnosis Date Noted  . Morbid obesity ([place]) 04/22/2016    Past Surgical History:  Procedure Laterality Date  . ABDOMINAL HYSTERECTOMY    . CHOLECYSTECTOMY    . KNEE SURGERY Left   . LAPAROSCOPIC GASTRIC SLEEVE RESECTION N/A 04/22/2016   Procedure: LAPAROSCOPIC GASTRIC SLEEVE RESECTION;  Surgeon: Bonner Puna, MD;  Location: [facility] ORS;  Service: General;  Laterality: N/A;  . SEPTOPLASTY      OB History   No obstetric history on file.      Home Medications    Prior to Admission medications   Medication Sig Start Date End Date Taking? Authorizing Provider  SUMAtriptan (IMITREX) 50 MG tablet Take 1-2 tablets (50-100 mg total) by mouth once for 1 dose. May repeat in 2 hours if headache persists or recurs. 10/31/20  Yes [provider]  thyroid (ARMOUR) 60 MG tablet Take 60 mg by mouth 2 (two) times daily.   Yes [provider]  topiramate (TOPAMAX) 25 MG tablet Take 25 mg by mouth 2 (two) times daily.   Yes [provider]  acetaminophen (TYLENOL) 500 MG tablet Take 1,000 mg by mouth every 6 (six) hours as needed.    [provider]  albuterol (PROVENTIL HFA;VENTOLIN HFA) 108 (90 Base) MCG/ACT inhaler Inhale 2 puffs into the lungs every 6 (six) hours as needed for wheezing or shortness of breath.    [provider]  FLUoxetine (PROZAC) 20 MG tablet Take 20 mg by mouth 2 (two) times daily.    [provider]  predniSONE (DELTASONE) 50 MG tablet Take 1 tablet (50 mg total) by mouth daily with breakfast. Patient not taking: Reported on 06/27/2020 10/26/19   Lavonia Drafts, MD    Family History Family History  Problem Relation Age of Onset  . Hypertension Mother   . Hypertension Father   . Cancer Other     Social History Social History   Tobacco Use  . Smoking status: Former Smoker    Packs/day: 1.00    Types: Cigarettes    Quit date: 03/22/2004    Years since quitting: 16.6  . Smokeless tobacco: Never Used  Vaping Use  . Vaping Use: Never used  Substance Use Topics  . Alcohol use: No  . Drug use: No     Allergies   Hydromorphone, Other, Hydrocodone-acetaminophen, Banana,  Citrullus vulgaris, Daucus carota, Lidocaine, Miconazole, and Tioconazole   Review of Systems Review of Systems   Physical Exam Triage Vital Signs ED Triage Vitals  Enc Vitals Group     BP 11/01/20 1425 106/65     Pulse Rate 11/01/20 1425 66     Resp 11/01/20 1425 18     Temp 11/01/20 1425 98.6 F (37 C)     Temp Source 11/01/20 1425 Oral     SpO2 11/01/20 1425 98 %     Weight --      Height --      Head Circumference --      Peak Flow --      Pain Score 11/01/20 1427 10     Pain Loc --      Pain Edu? --      Excl. in [place]? --    No data found.  Updated Vital Signs BP 106/65 (BP Location: Left Arm)   Pulse 66   Temp 98.6 F (37 C) (Oral)   Resp 18   SpO2 98%   Visual Acuity Right Eye Distance:   Left Eye Distance:   Bilateral Distance:    Right Eye Near:   Left Eye Near:     Bilateral Near:     Physical Exam Vitals and nursing note reviewed.  Constitutional:      General: She is not in acute distress.    Appearance: Normal appearance. She is not ill-appearing, toxic-appearing or diaphoretic.  HENT:     Head: Normocephalic.  Eyes:     Conjunctiva/sclera: Conjunctivae normal.  Pulmonary:     Effort: Pulmonary effort is normal.  Musculoskeletal:        General: Normal range of motion.     Cervical back: Normal range of motion.  Skin:    General: Skin is warm and dry.     Findings: No rash.  Neurological:     General: No focal deficit present.     Mental Status: She is alert.  Psychiatric:        Mood and Affect: Mood normal.      UC Treatments / Results  Labs (all labs ordered are listed, but only abnormal results are displayed) Labs Reviewed - No data to display  EKG   Radiology No results found.  Procedures Procedures (including critical care time)  Medications Ordered in UC Medications  ketorolac (TORADOL) 30 MG/ML injection 30 mg (30 mg Intramuscular Given 11/01/20 1512)  dexamethasone (DECADRON) injection 10 mg (10 mg Intramuscular Given 11/01/20 1512)  ondansetron (ZOFRAN-ODT) disintegrating tablet 4 mg (4 mg Oral Given 11/01/20 1512)    Initial Impression / Assessment and Plan / UC Course  I have reviewed the triage vital signs and the nursing notes.  Pertinent labs & imaging results that were available during my care of the patient were reviewed by me and considered in my medical decision making (see chart for details).     Chronic migraine Treated with migraine cocktail here today. Recommended rest, hydrate Follow up as needed for continued or worsening symptoms  Final Clinical Impressions(s) / UC Diagnoses   Final diagnoses:  Intractable chronic migraine without aura and with status migrainosus     Discharge Instructions     We gave you a migraine cocktail here today.  Rest, hydrate.  Prescribed medicines as  needed.  Follow up as needed for continued or worsening symptoms     ED Prescriptions    None     PDMP not reviewed  this encounter.   Orvan July, NP 11/02/20 1116

## 2020-11-21 ENCOUNTER — Ambulatory Visit
Admission: EM | Admit: 2020-11-21 | Discharge: 2020-11-21 | Disposition: A | Discharge status: Home / Self Care | Payer: Commercial Managed Care - PPO | Attending: Emergency Medicine | Admitting: Emergency Medicine

## 2020-11-21 ENCOUNTER — Other Ambulatory Visit: Payer: Self-pay

## 2020-11-21 DIAGNOSIS — J029 Acute pharyngitis, unspecified: Secondary | ICD-10-CM | POA: Insufficient documentation

## 2020-11-21 LAB — POCT RAPID STREP A (OFFICE): Rapid Strep A Screen: NEGATIVE

## 2020-11-21 MED ORDER — ALUM & MAG HYDROXIDE-SIMETH 200-200-20 MG/5ML PO SUSP
30.0000 mL | Freq: Once | ORAL | Status: AC
  Administered 2020-11-21: 30 mL via ORAL

## 2020-11-21 MED ORDER — LIDOCAINE VISCOUS HCL 2 % MT SOLN
15.0000 mL | Freq: Once | OROMUCOSAL | Status: AC
  Administered 2020-11-21: 15 mL via ORAL

## 2020-11-21 MED ORDER — LIDOCAINE VISCOUS HCL 2 % MT SOLN: 15.0000 mL | OROMUCOSAL | 0 refills | Status: DC | PRN

## 2020-11-21 NOTE — ED Provider Notes (Signed)
Roderic Palau    CSN: [id number] Arrival date & time: 11/21/20  0804      History   Chief Complaint Chief Complaint  Patient presents with  . Sore Throat  . Otalgia    HPI Sandra Sanders is a 40 y.o. female.   AVLEEN Sanders presents with complaints of sore throat and fever which started two days ago. Causes left ear pain. tmax yesterday up to 100.2. no cough or congestion. Headache. Severe pain with swallowing, therefore limiting po intake. No gi symptoms. No rash. No history of recurrent strep. No specific known ill contacts but had attended a funeral around many others, prior to onset of symptoms. Two negative home covid tests yesterday. Tylenol this morning helped with fevers.    ROS per HPI, negative if not otherwise mentioned.      Past Medical History:  Diagnosis Date  . Anemia   . Anxiety   . Arthritis   . Asthma   . Cancer (HCC)    Cervical Cancer  . COVID-19 09/2019  . Diabetes mellitus without complication ([place])   . GERD (gastroesophageal reflux disease)   . Headache   . Peripheral vascular disease ([place])    ablation right and left veins in legs  . PFO (patent foramen ovale)    as an infant  . Thyroid disease     Patient Active Problem List   Diagnosis Date Noted  . Morbid obesity ([place]) 04/22/2016    Past Surgical History:  Procedure Laterality Date  . ABDOMINAL HYSTERECTOMY    . CHOLECYSTECTOMY    . KNEE SURGERY Left   . LAPAROSCOPIC GASTRIC SLEEVE RESECTION N/A 04/22/2016   Procedure: LAPAROSCOPIC GASTRIC SLEEVE RESECTION;  Surgeon: Bonner Puna, MD;  Location: [facility] ORS;  Service: General;  Laterality: N/A;  . SEPTOPLASTY      OB History   No obstetric history on file.      Home Medications    Prior to Admission medications   Medication Sig Start Date End Date Taking? Authorizing Provider  acetaminophen (TYLENOL) 500 MG tablet Take 1,000 mg by mouth every 6 (six) hours as needed.   Yes [provider]  FLUoxetine  (PROZAC) 20 MG tablet Take 20 mg by mouth 2 (two) times daily.   Yes [provider]  lidocaine (XYLOCAINE) 2 % solution Use as directed 15 mLs in the mouth or throat every 4 (four) hours as needed for mouth pain. not to exceed 8 doses in 24 hours 11/21/20  Yes Ronie Barnhart B, NP  methylPREDNISolone (MEDROL DOSEPAK) 4 MG TBPK tablet Take by mouth. 11/20/20  Yes [provider]  SUMAtriptan (IMITREX) 50 MG tablet Take 1-2 tablets (50-100 mg total) by mouth once for 1 dose. May repeat in 2 hours if headache persists or recurs. 10/31/20  Yes [provider]  topiramate (TOPAMAX) 25 MG tablet Take 25 mg by mouth 2 (two) times daily.   Yes [provider]  albuterol (PROVENTIL HFA;VENTOLIN HFA) 108 (90 Base) MCG/ACT inhaler Inhale 2 puffs into the lungs every 6 (six) hours as needed for wheezing or shortness of breath.    [provider]  predniSONE (DELTASONE) 50 MG tablet Take 1 tablet (50 mg total) by mouth daily with breakfast. Patient not taking: No sig reported 10/26/19   Lavonia Drafts, MD  thyroid (ARMOUR) 60 MG tablet Take 60 mg by mouth 2 (two) times daily.    [provider]    Family History Family History  Problem Relation Age of Onset  . Hypertension Mother   . Hypertension Father   . Cancer Other     Social History Social History   Tobacco Use  . Smoking status: Former Smoker    Packs/day: 1.00    Types: Cigarettes    Quit date: 03/22/2004    Years since quitting: 16.6  . Smokeless tobacco: Never Used  Vaping Use  . Vaping Use: Never used  Substance Use Topics  . Alcohol use: No  . Drug use: No     Allergies   Hydromorphone, Other, Hydrocodone-acetaminophen, Banana, Citrullus vulgaris, Daucus carota, Lidocaine, Miconazole, and Tioconazole   Review of Systems Review of Systems   Physical Exam Triage Vital Signs ED Triage Vitals  Enc Vitals Group     BP 11/21/20 0826 98/65     Pulse Rate 11/21/20 0826 74     Resp  11/21/20 0826 16     Temp 11/21/20 0826 98.7 F (37.1 C)     Temp Source 11/21/20 0826 Oral     SpO2 11/21/20 0826 100 %     Weight --      Height --      Head Circumference --      Peak Flow --      Pain Score 11/21/20 0821 10     Pain Loc --      Pain Edu? --      Excl. in [place]? --    No data found.  Updated Vital Signs BP 98/65 (BP Location: Left Arm)   Pulse 74   Temp 98.7 F (37.1 C) (Oral)   Resp 16   SpO2 100%   Visual Acuity Right Eye Distance:   Left Eye Distance:   Bilateral Distance:    Right Eye Near:   Left Eye Near:    Bilateral Near:     Physical Exam Constitutional:      General: She is not in acute distress.    Appearance: She is well-developed.  HENT:     Right Ear: Tympanic membrane and ear canal normal.     Left Ear: Tympanic membrane and ear canal normal.     Mouth/Throat:     Pharynx: Posterior oropharyngeal erythema present.     Tonsils: Tonsillar exudate present. 1+ on the right. 1+ on the left.     Comments: Scant exudate to right tonsil noted, without swelling  Cardiovascular:     Rate and Rhythm: Normal rate.  Pulmonary:     Effort: Pulmonary effort is normal.  Skin:    General: Skin is warm and dry.  Neurological:     Mental Status: She is alert and oriented to person, place, and time.      UC Treatments / Results  Labs (all labs ordered are listed, but only abnormal results are displayed) Labs Reviewed  CULTURE, GROUP A STREP ([place])  NOVEL CORONAVIRUS, NAA  POCT RAPID STREP A (OFFICE)    EKG   Radiology No results found.  Procedures Procedures (including critical care time)  Medications Ordered in UC Medications  alum & mag hydroxide-simeth (MAALOX/MYLANTA) 200-200-20 MG/5ML suspension 30 mL (30 mLs Oral Given 11/21/20 0846)    And  lidocaine (XYLOCAINE) 2 % viscous mouth solution 15 mL (15 mLs Oral Given 11/21/20 0846)    Initial Impression / Assessment and Plan / UC Course  I have reviewed the triage vital  signs and the nursing notes.  Pertinent labs & imaging results that were available during my care of  the patient were reviewed by me and considered in my medical decision making (see chart for details).     Non toxic. Benign physical exam. Negative rapid strep with culture pending. covid pcr collected and pending as well. History and physical consistent with viral illness.  Supportive cares recommended. Return precautions provided. Patient verbalized understanding and agreeable to plan.    Final Clinical Impressions(s) / UC Diagnoses   Final diagnoses:  Acute pharyngitis, unspecified etiology     Discharge Instructions     Throat lozenges, gargles, chloraseptic spray, warm teas, popsicles etc to help with throat pain.   Topical lidocaine every 4 hours as needed, or over the counter products to help ease symptoms.  Tylenol and/or ibuprofen as needed for pain or fevers.  I would expect gradual improvement over the next week.  If symptoms worsen or do not improve in the next week to return to be seen or to follow up with your PCP.      ED Prescriptions    Medication Sig Dispense Auth. Provider   lidocaine (XYLOCAINE) 2 % solution Use as directed 15 mLs in the mouth or throat every 4 (four) hours as needed for mouth pain. not to exceed 8 doses in 24 hours 100 mL Augusto Gamble B, NP     PDMP not reviewed this encounter.   Zigmund Gottron, NP 11/21/20 [phone]

## 2020-11-21 NOTE — ED Triage Notes (Addendum)
Pt c/o sore throat, left ear pain, congestion, fever with Tmax 100.2 for approx 2 days.  Had virtual visit yesterday and was Rx medrol w/o improvement.  Reports decreased fluid intake 2/2 sore throat.   Denies n/v/d, cough, SOB, Last dose tylenol 1000mg  this morning 0630 Took Covid tests (x2) yesterday, both negative.  Did not take flu vaccine. Clarified pt's "lidocaine allergy " status. Pt states she does NOT have a negative allergic response to lidocaine, only that "it didn't work when used topically to start an IV".

## 2020-11-21 NOTE — Discharge Instructions (Signed)
Throat lozenges, gargles, chloraseptic spray, warm teas, popsicles etc to help with throat pain.   Topical lidocaine every 4 hours as needed, or over the counter products to help ease symptoms.  Tylenol and/or ibuprofen as needed for pain or fevers.  I would expect gradual improvement over the next week.  If symptoms worsen or do not improve in the next week to return to be seen or to follow up with your PCP.

## 2020-11-22 LAB — NOVEL CORONAVIRUS, NAA: SARS-CoV-2, NAA: NOT DETECTED

## 2020-11-22 LAB — SARS-COV-2, NAA 2 DAY TAT

## 2020-11-24 LAB — CULTURE, GROUP A STREP (THRC)

## 2020-12-27 DIAGNOSIS — R1013 Epigastric pain: Secondary | ICD-10-CM | POA: Insufficient documentation

## 2021-01-22 DIAGNOSIS — J3089 Other allergic rhinitis: Secondary | ICD-10-CM | POA: Insufficient documentation

## 2021-06-26 ENCOUNTER — Ambulatory Visit (INDEPENDENT_AMBULATORY_CARE_PROVIDER_SITE_OTHER): Payer: Commercial Managed Care - PPO

## 2021-06-26 ENCOUNTER — Ambulatory Visit (INDEPENDENT_AMBULATORY_CARE_PROVIDER_SITE_OTHER)
Admit: 2021-06-26 | Discharge: 2021-06-26 | Disposition: A | Discharge status: Home / Self Care | Payer: Commercial Managed Care - PPO | Attending: Family Medicine | Admitting: Family Medicine

## 2021-06-26 ENCOUNTER — Other Ambulatory Visit: Payer: Self-pay

## 2021-06-26 ENCOUNTER — Ambulatory Visit
Admission: EM | Admit: 2021-06-26 | Discharge: 2021-06-26 | Disposition: A | Discharge status: Home / Self Care | Payer: Commercial Managed Care - PPO | Attending: Family Medicine | Admitting: Family Medicine

## 2021-06-26 DIAGNOSIS — W11XXXA Fall on and from ladder, initial encounter: Secondary | ICD-10-CM

## 2021-06-26 DIAGNOSIS — S0990XA Unspecified injury of head, initial encounter: Secondary | ICD-10-CM

## 2021-06-26 DIAGNOSIS — W19XXXA Unspecified fall, initial encounter: Secondary | ICD-10-CM | POA: Diagnosis not present

## 2021-06-26 DIAGNOSIS — M542 Cervicalgia: Secondary | ICD-10-CM | POA: Diagnosis not present

## 2021-06-26 DIAGNOSIS — M79671 Pain in right foot: Secondary | ICD-10-CM

## 2021-06-26 DIAGNOSIS — M25571 Pain in right ankle and joints of right foot: Secondary | ICD-10-CM | POA: Diagnosis not present

## 2021-06-26 MED ORDER — DICLOFENAC SODIUM 75 MG PO TBEC: 75.0000 mg | DELAYED_RELEASE_TABLET | Freq: Two times a day (BID) | ORAL | 0 refills | Status: DC | PRN

## 2021-06-26 NOTE — ED Provider Notes (Signed)
[facility]    CSN: [id number] Arrival date & time: 06/26/21  1717      History   Chief Complaint Chief Complaint  Patient presents with   Fall    From 38ft ladder   Ankle Pain    right   Head Injury    HPI 40 year old female presents for evaluation of the above.  Patient states that she had a migraine headache at work and left work early.  She went home and pain in the trim on her house.  She was on an 8 foot ladder and fell backwards.  She states that she hit her head on bricks and has a knot inside of her head.  She denies loss of consciousness.  She does report some nausea.  No vomiting.  She does note some dizziness.  She has headache as well.  Additionally, patient reports severe pain of the right foot and ankle.  There is no appreciable bruising.  He states that the pain is 10/10 in severity.  She is concerned that she may have a fracture.  When asked about neck discomfort, she states that she has some mild pain.  Denies back pain.  She has some bruising to the left leg.  No other injuries that she is aware of.  Past Medical History:  Diagnosis Date   Anemia    Anxiety    Arthritis    Asthma    Cancer ([place])    Cervical Cancer   COVID-19 09/2019   Diabetes mellitus without complication (HCC)    GERD (gastroesophageal reflux disease)    Headache    PFO (patent foramen ovale)    as an infant   Thyroid disease     Patient Active Problem List   Diagnosis Date Noted   Morbid obesity ([place]) 04/22/2016    Past Surgical History:  Procedure Laterality Date   ABDOMINAL HYSTERECTOMY     CHOLECYSTECTOMY     KNEE SURGERY Left    LAPAROSCOPIC GASTRIC SLEEVE RESECTION N/A 04/22/2016   Procedure: LAPAROSCOPIC GASTRIC SLEEVE RESECTION;  Surgeon: Bonner Puna, MD;  Location: [facility] ORS;  Service: General;  Laterality: N/A;   SEPTOPLASTY      OB History   No obstetric history on file.      Home Medications    Prior to Admission medications   Medication Sig  Start Date End Date Taking? Authorizing Provider  acetaminophen (TYLENOL) 500 MG tablet Take 1,000 mg by mouth every 6 (six) hours as needed.   Yes [provider]  albuterol (PROVENTIL HFA;VENTOLIN HFA) 108 (90 Base) MCG/ACT inhaler Inhale 2 puffs into the lungs every 6 (six) hours as needed for wheezing or shortness of breath.   Yes [provider]  ALPRAZolam Duanne Moron) 0.5 MG tablet Take 0.5-1 mg by mouth 3 (three) times daily as needed. 06/03/21  Yes [provider]  amphetamine-dextroamphetamine (ADDERALL) 20 MG tablet Take 20 mg by mouth 4 (four) times daily. 06/14/21  Yes [provider]  cyclobenzaprine (FLEXERIL) 10 MG tablet Take 10 mg by mouth 3 (three) times daily. 05/23/21  Yes [provider]  diclofenac (VOLTAREN) 75 MG EC tablet Take 1 tablet (75 mg total) by mouth 2 (two) times daily as needed for moderate pain. 06/26/21  Yes Tita Terhaar G, DO  FLUoxetine (PROZAC) 20 MG tablet Take 20 mg by mouth 2 (two) times daily.   Yes [provider]  pantoprazole (PROTONIX) 40 MG tablet Take 40 mg by mouth 2 (two) times  daily. 06/21/21  Yes [provider]  SUMAtriptan (IMITREX) 50 MG tablet Take 1-2 tablets (50-100 mg total) by mouth once for 1 dose. May repeat in 2 hours if headache persists or recurs. 10/31/20  Yes [provider]  thyroid (ARMOUR) 60 MG tablet Take 60 mg by mouth 2 (two) times daily.   Yes [provider]  topiramate (TOPAMAX) 25 MG tablet Take 25 mg by mouth 2 (two) times daily.   Yes [provider]  lidocaine (XYLOCAINE) 2 % solution Use as directed 15 mLs in the mouth or throat every 4 (four) hours as needed for mouth pain. not to exceed 8 doses in 24 hours 11/21/20   Zigmund Gottron, NP    Family History Family History  Problem Relation Age of Onset   Hypertension Mother    Hypertension Father    Cancer Other     Social History Social History   Tobacco Use   Smoking status: Former     Packs/day: 1.00    Types: Cigarettes    Quit date: 03/22/2004    Years since quitting: 17.2   Smokeless tobacco: Never  Vaping Use   Vaping Use: Never used  Substance Use Topics   Alcohol use: No   Drug use: No     Allergies   Hydromorphone, Other, Hydrocodone-acetaminophen, Banana, Citrullus vulgaris, Daucus carota, Lidocaine, Miconazole, and Tioconazole   Review of Systems Review of Systems Per HPI  Physical Exam Triage Vital Signs ED Triage Vitals  Enc Vitals Group     BP 06/26/21 1752 99/74     Pulse Rate 06/26/21 1752 91     Resp 06/26/21 1752 18     Temp 06/26/21 1752 98.8 F (37.1 C)     Temp Source 06/26/21 1752 Oral     SpO2 06/26/21 1752 100 %     Weight 06/26/21 1749 138 lb (62.6 kg)     Height 06/26/21 1749 5\' 9"  (1.753 m)     Head Circumference --      Peak Flow --      Pain Score 06/26/21 1748 10     Pain Loc --      Pain Edu? --      Excl. in [place]? --    Updated Vital Signs BP 99/74 (BP Location: Left Arm)   Pulse 91   Temp 98.8 F (37.1 C) (Oral)   Resp 18   Ht 5\' 9"  (1.753 m)   Wt 62.6 kg   SpO2 100%   BMI 20.38 kg/m   Visual Acuity Right Eye Distance:   Left Eye Distance:   Bilateral Distance:    Right Eye Near:   Left Eye Near:    Bilateral Near:     Physical Exam Vitals and nursing note reviewed.  Constitutional:      Comments: Appears fatigued but is not in any acute distress.  HENT:     Head:      Comments: Hematoma noted at the labeled location.    Right Ear: Tympanic membrane normal.     Left Ear: Tympanic membrane normal.     Nose: Nose normal.     Mouth/Throat:     Pharynx: Oropharynx is clear.  Eyes:     General:        Right eye: No discharge.        Left eye: No discharge.     Conjunctiva/sclera: Conjunctivae normal.     Pupils: Pupils are equal, round, and reactive to light.  Cardiovascular:     Rate and Rhythm: Normal rate and regular rhythm.  Pulmonary:     Effort: Pulmonary effort is normal.      Breath sounds: Normal breath sounds. No wheezing, rhonchi or rales.  Musculoskeletal:     Comments: Right foot and ankle -no discrete areas of tenderness to palpation on physical exam.  Normal passive range of motion.  Achilles intact.  There is no appreciable swelling or bruising.  Neurological:     General: No focal deficit present.     Mental Status: She is alert.  Psychiatric:     Comments: Flat affect.     UC Treatments / Results  Labs (all labs ordered are listed, but only abnormal results are displayed) Labs Reviewed - No data to display  EKG   Radiology DG Ankle Complete Right  Result Date: 06/26/2021 CLINICAL DATA:  Fall EXAM: RIGHT FOOT COMPLETE - 3+ VIEW; RIGHT ANKLE - COMPLETE 3+ VIEW COMPARISON:  None. FINDINGS: There is no evidence of fracture or dislocation. Achilles calcaneal enthesophyte. The joint spaces are preserved. The ankle mortise is intact. Soft tissues are unremarkable. IMPRESSION: Negative. Electronically Signed   By: Merilyn Baba M.D.   On: 06/26/2021 18:42   CT Head Wo Contrast  Result Date: 06/26/2021 CLINICAL DATA:  [place] off ladder and hit head EXAM: CT HEAD WITHOUT CONTRAST TECHNIQUE: Contiguous axial images were obtained from the base of the skull through the vertex without intravenous contrast. COMPARISON:  None. FINDINGS: Brain: No evidence of acute infarction, hemorrhage, hydrocephalus, extra-axial collection or mass lesion/mass effect. Vascular: No hyperdense vessel or unexpected calcification. Skull: Normal. Negative for fracture or focal lesion. Sinuses/Orbits: No acute finding. Other: None IMPRESSION: Negative non contrasted CT appearance of the brain Electronically Signed   By: Donavan Foil M.D.   On: 06/26/2021 18:31   CT Cervical Spine Wo Contrast  Result Date: 06/26/2021 CLINICAL DATA:  Neck injury. EXAM: CT CERVICAL SPINE WITHOUT CONTRAST TECHNIQUE: Multidetector CT imaging of the cervical spine was performed without intravenous contrast.  Multiplanar CT image reconstructions were also generated. COMPARISON:  None. FINDINGS: Alignment: Normal. Skull base and vertebrae: No acute fracture. No primary bone lesion or focal pathologic process. Soft tissues and spinal canal: No prevertebral fluid or swelling. No visible canal hematoma. Disc levels:  Mild degenerative disc disease is noted at C5-6. Upper chest: Negative. Other: None. IMPRESSION: Mild degenerative disc disease is noted at C5-6. No acute abnormality is noted. Electronically Signed   By: Marijo Conception M.D.   On: 06/26/2021 18:41   DG Foot Complete Right  Result Date: 06/26/2021 CLINICAL DATA:  Fall EXAM: RIGHT FOOT COMPLETE - 3+ VIEW; RIGHT ANKLE - COMPLETE 3+ VIEW COMPARISON:  None. FINDINGS: There is no evidence of fracture or dislocation. Achilles calcaneal enthesophyte. The joint spaces are preserved. The ankle mortise is intact. Soft tissues are unremarkable. IMPRESSION: Negative. Electronically Signed   By: Merilyn Baba M.D.   On: 06/26/2021 18:42    Procedures Procedures (including critical care time)  Medications Ordered in UC Medications - No data to display  Initial Impression / Assessment and Plan / UC Course  I have reviewed the triage vital signs and the nursing notes.  Pertinent labs & imaging results that were available during my care of the patient were reviewed by me and considered in my medical decision making (see chart for details).    40 year old female presents with a complicated injury.  Patient fell off of a 8 foot ladder suffering  a head injury as well as injury to her right lower extremity.  Given the mechanism of injury and palpable hematoma, CT head and neck was obtained.  CT head and neck were negative for acute abnormalities.  X-rays were obtained of her right foot and ankle and were independently reviewed by me.  Interpretation: Normal x-rays.  No evidence of fracture.  Patient is allergic to opiate pain medications.  I am treating her with  supportive care and diclofenac.  Work note given.    Final Clinical Impressions(s) / UC Diagnoses   Final diagnoses:  Traumatic injury of head, initial encounter  Fall from ladder, initial encounter  Pain in joint involving right ankle and foot     Discharge Instructions      Imaging was negative.  Rest, ice.  You need to take it easy - cognitive and physical rest.  Lots of fluids.   Medication as directed for pain.  Take care  Dr. Lacinda Axon      ED Prescriptions     Medication Sig Dispense Auth. Provider   diclofenac (VOLTAREN) 75 MG EC tablet Take 1 tablet (75 mg total) by mouth 2 (two) times daily as needed for moderate pain. 14 tablet Coral Spikes, DO      PDMP not reviewed this encounter.   Coral Spikes, [place] 06/26/21 1912

## 2021-06-26 NOTE — ED Notes (Signed)
Per [facility] pt plan does not require PA for CT scans, [id number] and [id number].

## 2021-06-26 NOTE — ED Triage Notes (Signed)
Pt c/o fall from the top rung of an 85ft ladder earlier today. Pt reports hitting her head against bricks, she has knots to the right and left side of her head. Pt also injured her right ankle. Pt states she basically bounced down the ladder and onto the bricks. Pt also reports dizziness, headache and being very sleepy. Pt denies LOC or any other known injuries.

## 2021-06-26 NOTE — Discharge Instructions (Addendum)
Imaging was negative.  Rest, ice.  You need to take it easy - cognitive and physical rest.  Lots of fluids.   Medication as directed for pain.  Take care  Dr. Lacinda Axon

## 2021-06-27 DIAGNOSIS — S86919A Strain of unspecified muscle(s) and tendon(s) at lower leg level, unspecified leg, initial encounter: Secondary | ICD-10-CM | POA: Insufficient documentation

## 2021-06-27 DIAGNOSIS — G43909 Migraine, unspecified, not intractable, without status migrainosus: Secondary | ICD-10-CM | POA: Insufficient documentation

## 2021-07-09 DIAGNOSIS — S9001XA Contusion of right ankle, initial encounter: Secondary | ICD-10-CM | POA: Insufficient documentation

## 2021-07-09 DIAGNOSIS — M25571 Pain in right ankle and joints of right foot: Secondary | ICD-10-CM | POA: Insufficient documentation

## 2022-01-08 ENCOUNTER — Encounter: Payer: Self-pay | Admitting: Emergency Medicine

## 2022-01-08 ENCOUNTER — Ambulatory Visit (INDEPENDENT_AMBULATORY_CARE_PROVIDER_SITE_OTHER): Payer: Commercial Managed Care - PPO

## 2022-01-08 ENCOUNTER — Ambulatory Visit
Admission: EM | Admit: 2022-01-08 | Discharge: 2022-01-08 | Disposition: A | Discharge status: Home / Self Care | Payer: Commercial Managed Care - PPO | Attending: Physician Assistant | Admitting: Physician Assistant

## 2022-01-08 DIAGNOSIS — J189 Pneumonia, unspecified organism: Secondary | ICD-10-CM | POA: Diagnosis not present

## 2022-01-08 DIAGNOSIS — Z20822 Contact with and (suspected) exposure to covid-19: Secondary | ICD-10-CM | POA: Insufficient documentation

## 2022-01-08 DIAGNOSIS — J45909 Unspecified asthma, uncomplicated: Secondary | ICD-10-CM | POA: Insufficient documentation

## 2022-01-08 DIAGNOSIS — M47814 Spondylosis without myelopathy or radiculopathy, thoracic region: Secondary | ICD-10-CM | POA: Insufficient documentation

## 2022-01-08 DIAGNOSIS — R0602 Shortness of breath: Secondary | ICD-10-CM | POA: Insufficient documentation

## 2022-01-08 DIAGNOSIS — R051 Acute cough: Secondary | ICD-10-CM | POA: Insufficient documentation

## 2022-01-08 DIAGNOSIS — Z8616 Personal history of COVID-19: Secondary | ICD-10-CM | POA: Insufficient documentation

## 2022-01-08 DIAGNOSIS — R0981 Nasal congestion: Secondary | ICD-10-CM | POA: Diagnosis not present

## 2022-01-08 DIAGNOSIS — Z8614 Personal history of Methicillin resistant Staphylococcus aureus infection: Secondary | ICD-10-CM | POA: Insufficient documentation

## 2022-01-08 DIAGNOSIS — R0989 Other specified symptoms and signs involving the circulatory and respiratory systems: Secondary | ICD-10-CM

## 2022-01-08 DIAGNOSIS — R059 Cough, unspecified: Secondary | ICD-10-CM | POA: Diagnosis not present

## 2022-01-08 LAB — RESP PANEL BY RT-PCR (FLU A&B, COVID) ARPGX2
Influenza A by PCR: NEGATIVE
Influenza B by PCR: NEGATIVE
SARS Coronavirus 2 by RT PCR: NEGATIVE

## 2022-01-08 MED ORDER — AMOXICILLIN-POT CLAVULANATE 875-125 MG PO TABS: 1.0000 | ORAL_TABLET | Freq: Two times a day (BID) | ORAL | 0 refills | Status: AC

## 2022-01-08 MED ORDER — AZITHROMYCIN 250 MG PO TABS: 250.0000 mg | ORAL_TABLET | Freq: Every day | ORAL | 0 refills | Status: DC

## 2022-01-08 MED ORDER — ALBUTEROL SULFATE HFA 108 (90 BASE) MCG/ACT IN AERS: 1.0000 | INHALATION_SPRAY | RESPIRATORY_TRACT | 0 refills | Status: DC | PRN

## 2022-01-08 MED ORDER — PROMETHAZINE-DM 6.25-15 MG/5ML PO SYRP: 5.0000 mL | ORAL_SOLUTION | Freq: Four times a day (QID) | ORAL | 0 refills | Status: DC | PRN

## 2022-01-08 NOTE — Discharge Instructions (Signed)
-   Increase your rest and fluid intake. ?- Follow-up with [place] as needed.-Your test was negative for flu and COVID but your x-ray does show that you have pneumonia.  I sent 2 different antibiotics to pharmacy as well as cough medicine and refilled your inhaler.  Use your inhaler as needed when you feel short of breath but if your breathing worsens and is not relieved by use of the inhaler, call 911 or have someone take you to the ER. ?- Let your primary care provider know that you have pneumonia.  You will need to have a repeat x-ray in a couple of weeks to make sure the pneumonia is cleared up. ?

## 2022-01-08 NOTE — ED Triage Notes (Signed)
Pt reports shortness of breath, nasal congestion, body aches, headache and cough. States symptoms began yesterday. Also reports pain when taking a deep breath.  ?

## 2022-01-08 NOTE — ED Provider Notes (Signed)
?[place] ? ? ? ?CSN: [id number] ?Arrival date & time: 01/08/22  0913 ? ? ?  ? ?History   ?Chief Complaint ?Chief Complaint  ?Patient presents with  ? Generalized Body Aches  ? Shortness of Breath  ? Cough  ? Nasal Congestion  ? ? ?HPI ?Sandra Sanders is a 41 y.o. female presenting for sudden onset of nasal congestion, runny nose, cough, body aches, headaches, right-sided rib pain especially when she takes a deep breath or coughs and feeling short of breath.  Symptom onset was yesterday.  Denies any associated fever but says she has not checked.  Denies any sick contacts history of COVID-19 in January 2021.  Has history of asthma.  Patient says her present albuterol inhaler is from 2017.  Patient also says there is concern for possible MS but she is awaiting an MRI.  Has taken over-the-counter NyQuil for symptoms.  No other complaints. ? ?HPI ? ?Past Medical History:  ?Diagnosis Date  ? Anemia   ? Anxiety   ? Arthritis   ? Asthma   ? Cancer [facility])   ? Cervical Cancer  ? COVID-19 09/2019  ? Diabetes mellitus without complication ([place])   ? GERD (gastroesophageal reflux disease)   ? Headache   ? PFO (patent foramen ovale)   ? as an infant  ? Thyroid disease   ? ? ?Patient Active Problem List  ? Diagnosis Date Noted  ? Hx MRSA infection 01/08/2022  ? Pain in joint of right ankle 07/09/2021  ? Contusion of right ankle 07/09/2021  ? Muscle strain of lower extremity 06/27/2021  ? Migraine 06/27/2021  ? Non-seasonal allergic rhinitis 01/22/2021  ? Epigastric pain 12/27/2020  ? B12 deficiency 09/23/2018  ? Degenerative disc disease at L5-S1 level 12/30/2017  ? Gastroesophageal reflux disease 05/26/2017  ? 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 Hematochezia 11/19/2016  ? Morbid obesity ([place]) 04/22/2016  ? Iron deficiency 03/18/2016  ? Helicobacter pylori gastrointestinal tract infection 01/18/2016  ? Vitamin D deficiency 01/18/2016  ? Fatigue 12/18/2015  ? Malaise  12/18/2015  ? Snoring 12/18/2015  ? Varicose veins of bilateral lower extremities with other complications 44/31/5400  ? Thyroid disease 02/23/2014  ? Syncope 12/09/2013  ? Insomnia 09/19/2013  ? 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 Left knee pain 06/30/2013  ? ANA positive 03/08/2013  ? 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 Edema 10/16/2011  ? Anxiety 09/17/2011  ? Asthma, well controlled 06/25/2011  ? ? ?Past Surgical History:  ?Procedure Laterality Date  ? ABDOMINAL HYSTERECTOMY    ? CHOLECYSTECTOMY    ? KNEE SURGERY Left   ? LAPAROSCOPIC GASTRIC SLEEVE RESECTION N/A 04/22/2016  ? Procedure: LAPAROSCOPIC GASTRIC SLEEVE RESECTION;  Surgeon: Bonner Puna, MD;  Location: [facility] ORS;  Service: General;  Laterality: N/A;  ? SEPTOPLASTY    ? ? ?OB History   ?No obstetric history on file. ?  ? ? ? ?Home Medications   ? ?Prior to Admission medications   ?Medication Sig Start Date End Date Taking? Authorizing Provider  ?albuterol (VENTOLIN HFA) 108 (90 Base) MCG/ACT inhaler Inhale 1-2 puffs into the lungs every 4 (four) hours as needed for wheezing or shortness of breath. 01/08/22  Yes Laurene Footman B, PA-C  ?amoxicillin-clavulanate (AUGMENTIN) 875-125 MG tablet Take 1 tablet by mouth every 12 (twelve) hours for 7 days. 01/08/22 01/15/22 Yes Danton Clap, PA-C  ?azithromycin (ZITHROMAX) 250 MG tablet Take 1 tablet (250 mg total)  by mouth daily. Take first 2 tablets together, then 1 every day until finished. 01/08/22  Yes Danton Clap, PA-C  ?promethazine-dextromethorphan (PROMETHAZINE-DM) 6.25-15 MG/5ML syrup Take 5 mLs by mouth 4 (four) times daily as needed. 01/08/22  Yes Laurene Footman B, PA-C  ?acetaminophen (TYLENOL) 500 MG tablet Take 1,000 mg by mouth every 6 (six) hours as needed.    [provider]  ?ALPRAZolam Duanne Moron) 0.5 MG tablet Take 0.5-1 mg by mouth 3 (three) times daily as needed. 06/03/21   [provider]  ?amphetamine-dextroamphetamine (ADDERALL) 20 MG tablet Take 20 mg by mouth 4 (four)  times daily. 06/14/21   [provider]  ?cyclobenzaprine (FLEXERIL) 10 MG tablet Take 10 mg by mouth 3 (three) times daily. 05/23/21   [provider]  ?diclofenac (VOLTAREN) 75 MG EC tablet Take 1 tablet (75 mg total) by mouth 2 (two) times daily as needed for moderate pain. 06/26/21   Coral Spikes, DO  ?FLUoxetine (PROZAC) 20 MG tablet Take 20 mg by mouth 2 (two) times daily.    [provider]  ?lidocaine (XYLOCAINE) 2 % solution Use as directed 15 mLs in the mouth or throat every 4 (four) hours as needed for mouth pain. not to exceed 8 doses in 24 hours 11/21/20   Augusto Gamble B, NP  ?pantoprazole (PROTONIX) 40 MG tablet Take 40 mg by mouth 2 (two) times daily. 06/21/21   [provider]  ?SUMAtriptan (IMITREX) 50 MG tablet Take 1-2 tablets (50-100 mg total) by mouth once for 1 dose. May repeat in 2 hours if headache persists or recurs. 10/31/20   [provider]  ?thyroid (ARMOUR) 60 MG tablet Take 60 mg by mouth 2 (two) times daily.    [provider]  ?topiramate (TOPAMAX) 25 MG tablet Take 25 mg by mouth 2 (two) times daily.    [provider]  ? ? ?Family History ?Family History  ?Problem Relation Age of Onset  ? Hypertension Mother   ? Hypertension Father   ? Cancer Other   ? ? ?Social History ?Social History  ? ?Tobacco Use  ? Smoking status: Former  ?  Packs/day: 1.00  ?  Types: Cigarettes  ?  Quit date: 03/22/2004  ?  Years since quitting: 17.8  ? Smokeless tobacco: Never  ?Vaping Use  ? Vaping Use: Never used  ?Substance Use Topics  ? Alcohol use: No  ? Drug use: No  ? ? ? ?Allergies   ?Hydromorphone, Other, Hydrocodone, Tramadol, Hydrocodone-acetaminophen, Banana, Citrullus vulgaris, Daucus carota, Lidocaine, Miconazole, and Tioconazole ? ? ?Review of Systems ?Review of Systems  ?Constitutional:  Positive for fatigue. Negative for chills, diaphoresis and fever.  ?HENT:  Positive for congestion and rhinorrhea. Negative for ear pain, sinus  pressure, sinus pain and sore throat.   ?Respiratory:  Positive for cough and shortness of breath. Negative for wheezing.   ?Cardiovascular:  Negative for chest pain.  ?Gastrointestinal:  Negative for abdominal pain, nausea and vomiting.  ?Musculoskeletal:  Positive for myalgias. Negative for arthralgias.  ?Skin:  Negative for rash.  ?Neurological:  Positive for headaches. Negative for weakness.  ?Hematological:  Negative for adenopathy.  ? ? ?Physical Exam ?Triage Vital Signs ?ED Triage Vitals  ?Enc Vitals Group  ?   BP 01/08/22 0927 100/64  ?   Pulse Rate 01/08/22 0927 (!) 103  ?   Resp 01/08/22 0927 19  ?   Temp 01/08/22 0927 99.7 ?F (37.6 ?C)  ?   Temp Source 01/08/22  3762 Oral  ?   SpO2 01/08/22 0927 100 %  ?   Weight 01/08/22 0924 138 lb 0.1 oz (62.6 kg)  ?   Height 01/08/22 0924 '5\' 9"'$  (1.753 m)  ?   Head Circumference --   ?   Peak Flow --   ?   Pain Score 01/08/22 0924 10  ?   Pain Loc --   ?   Pain Edu? --   ?   Excl. in [place]? --   ? ?No data found. ? ?Updated Vital Signs ?BP 100/64 (BP Location: Left Arm)   Pulse (!) 103   Temp 99.7 ?F (37.6 ?C) (Oral)   Resp 19   Ht '5\' 9"'$  (1.753 m)   Wt 138 lb 0.1 oz (62.6 kg)   SpO2 100%   BMI 20.38 kg/m?  ?   ? ?Physical Exam ?Vitals and nursing note reviewed.  ?Constitutional:   ?   General: She is not in acute distress. ?   Appearance: Normal appearance. She is well-developed. She is ill-appearing. She is not toxic-appearing.  ?HENT:  ?   Head: Normocephalic and atraumatic.  ?   Nose: Congestion (mild) present.  ?   Mouth/Throat:  ?   Mouth: Mucous membranes are moist.  ?   Pharynx: Oropharynx is clear.  ?Eyes:  ?   General: No scleral icterus.    ?   Right eye: No discharge.     ?   Left eye: No discharge.  ?   Conjunctiva/sclera: Conjunctivae normal.  ?Cardiovascular:  ?   Rate and Rhythm: Regular rhythm. Tachycardia present.  ?   Heart sounds: Normal heart sounds.  ?Pulmonary:  ?   Effort: Pulmonary effort is normal. No respiratory distress.  ?   Breath  sounds: Wheezing (RML) present. No rhonchi or rales.  ?   Comments: Has some difficulty taking deep breaths due to cough and feeling short of breath. ?Musculoskeletal:  ?   Cervical back: Neck supple.  ?Skin: ?   General: Skin

## 2022-01-15 ENCOUNTER — Other Ambulatory Visit: Payer: Self-pay | Admitting: Family Medicine

## 2022-01-15 DIAGNOSIS — R079 Chest pain, unspecified: Secondary | ICD-10-CM

## 2022-01-23 ENCOUNTER — Other Ambulatory Visit: Payer: Self-pay | Admitting: Family Medicine

## 2022-01-23 ENCOUNTER — Ambulatory Visit
Admission: RE | Admit: 2022-01-23 | Discharge: 2022-01-23 | Disposition: A | Discharge status: Home / Self Care | Payer: Commercial Managed Care - PPO | Source: Ambulatory Visit | Attending: Family Medicine | Admitting: Family Medicine

## 2022-01-23 ENCOUNTER — Other Ambulatory Visit: Payer: Commercial Managed Care - PPO

## 2022-01-23 DIAGNOSIS — R2689 Other abnormalities of gait and mobility: Secondary | ICD-10-CM

## 2022-01-23 DIAGNOSIS — R079 Chest pain, unspecified: Secondary | ICD-10-CM

## 2022-01-23 DIAGNOSIS — R5382 Chronic fatigue, unspecified: Secondary | ICD-10-CM

## 2022-01-23 DIAGNOSIS — R531 Weakness: Secondary | ICD-10-CM

## 2022-01-23 MED ORDER — GADOBENATE DIMEGLUMINE 529 MG/ML IV SOLN
13.0000 mL | Freq: Once | INTRAVENOUS | Status: AC | PRN
  Administered 2022-01-23: 13 mL via INTRAVENOUS

## 2022-01-28 ENCOUNTER — Ambulatory Visit
Admission: RE | Admit: 2022-01-28 | Discharge: 2022-01-28 | Disposition: A | Discharge status: Home / Self Care | Payer: Commercial Managed Care - PPO | Source: Ambulatory Visit | Attending: Family Medicine | Admitting: Family Medicine

## 2022-01-28 DIAGNOSIS — R531 Weakness: Secondary | ICD-10-CM

## 2022-01-28 DIAGNOSIS — R5382 Chronic fatigue, unspecified: Secondary | ICD-10-CM

## 2022-01-28 DIAGNOSIS — R2689 Other abnormalities of gait and mobility: Secondary | ICD-10-CM

## 2022-01-28 MED ORDER — GADOBENATE DIMEGLUMINE 529 MG/ML IV SOLN
15.0000 mL | Freq: Once | INTRAVENOUS | Status: AC | PRN
  Administered 2022-01-28: 14 mL via INTRAVENOUS

## 2022-01-29 ENCOUNTER — Other Ambulatory Visit: Payer: Commercial Managed Care - PPO

## 2022-02-27 ENCOUNTER — Other Ambulatory Visit
Admission: RE | Admit: 2022-02-27 | Discharge: 2022-02-27 | Disposition: A | Discharge status: Home / Self Care | Payer: Commercial Managed Care - PPO | Source: Ambulatory Visit | Attending: Family Medicine | Admitting: Family Medicine

## 2022-02-27 DIAGNOSIS — R079 Chest pain, unspecified: Secondary | ICD-10-CM | POA: Diagnosis present

## 2022-02-27 DIAGNOSIS — R0602 Shortness of breath: Secondary | ICD-10-CM | POA: Diagnosis present

## 2022-02-27 LAB — D-DIMER, QUANTITATIVE: D-Dimer, Quant: 0.36 ug/mL-FEU (ref 0.00–0.50)

## 2022-02-28 ENCOUNTER — Ambulatory Visit
Admission: RE | Admit: 2022-02-28 | Discharge: 2022-02-28 | Disposition: A | Discharge status: Home / Self Care | Payer: Commercial Managed Care - PPO | Source: Ambulatory Visit | Attending: Family Medicine | Admitting: Family Medicine

## 2022-02-28 ENCOUNTER — Other Ambulatory Visit: Payer: Self-pay | Admitting: Family Medicine

## 2022-02-28 DIAGNOSIS — R600 Localized edema: Secondary | ICD-10-CM | POA: Insufficient documentation

## 2022-02-28 DIAGNOSIS — R079 Chest pain, unspecified: Secondary | ICD-10-CM

## 2022-04-20 ENCOUNTER — Ambulatory Visit
Admission: RE | Admit: 2022-04-20 | Discharge: 2022-04-20 | Disposition: A | Discharge status: Home / Self Care | Payer: Commercial Managed Care - PPO | Source: Ambulatory Visit | Attending: Family Medicine | Admitting: Family Medicine

## 2022-04-20 VITALS — BP 119/72 | HR 88 | Temp 98.4°F | Resp 14 | Ht 69.0 in | Wt 138.0 lb

## 2022-04-20 DIAGNOSIS — J0100 Acute maxillary sinusitis, unspecified: Secondary | ICD-10-CM

## 2022-04-20 MED ORDER — DOXYCYCLINE HYCLATE 100 MG PO TABS: 100.0000 mg | ORAL_TABLET | Freq: Two times a day (BID) | ORAL | 0 refills | Status: DC

## 2022-04-20 NOTE — ED Provider Notes (Signed)
[facility]    CSN: [id number] Arrival date & time: 04/20/22  0803      History   Chief Complaint Chief Complaint  Patient presents with   Ear Fullness   Otalgia    right    HPI 41 year old female presents for evaluation of the above.  Patient reports that at the end of June she experienced ear pain and was prescribed amoxicillin via a telehealth.  She states that last antibiotics did not have improvement thus she called back and was subsequently prescribed eardrops.  Patient states that she had improvement.  Past week or more she has been experiencing sinus pressure and congestion as well as bilateral ear fullness and discomfort.  No fever.  No relieving factors.  No other reported symptoms.  No other complaints.  Past Medical History:  Diagnosis Date   Anemia    Anxiety    Arthritis    Asthma    Cancer ([place])    Cervical Cancer   COVID-19 09/2019   Diabetes mellitus without complication ([facility])    GERD (gastroesophageal reflux disease)    Headache    PFO (patent foramen ovale)    as an infant   Thyroid disease     Patient Active Problem List   Diagnosis Date Noted   Hx MRSA infection 01/08/2022   Pain in joint of right ankle 07/09/2021   Contusion of right ankle 07/09/2021   Muscle strain of lower extremity 06/27/2021   Migraine 06/27/2021   Non-seasonal allergic rhinitis 01/22/2021   Epigastric pain 12/27/2020   B12 deficiency 09/23/2018   Degenerative disc disease at L5-S1 level 12/30/2017   Gastroesophageal reflux disease 05/26/2017   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   Hematochezia 11/19/2016   Morbid obesity ([place]) 04/22/2016   Iron deficiency 32/44/0102   Helicobacter pylori gastrointestinal tract infection 01/18/2016   Vitamin D deficiency 01/18/2016   Fatigue 12/18/2015   Malaise 12/18/2015   Snoring 12/18/2015   Varicose veins of bilateral lower extremities with other complications  72/53/6644   Thyroid disease 02/23/2014   Syncope 12/09/2013   Insomnia 09/19/2013   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      Home Medications    Prior to Admission medications   Medication Sig Start Date End Date Taking? Authorizing Provider  ALPRAZolam Duanne Moron) 0.5 MG tablet Take 0.5-1 mg by mouth 3 (three) times daily as needed. 06/03/21  Yes [provider]  amphetamine-dextroamphetamine (ADDERALL) 20 MG tablet Take 20 mg by mouth 4 (four) times daily. 06/14/21  Yes [provider]  aspirin EC 81 MG tablet Take 81 mg by mouth daily. Swallow whole.   Yes [provider]  doxycycline (VIBRA-TABS) 100 MG tablet Take 1 tablet (100 mg total) by mouth 2 (two) times daily. 04/20/22  Yes Jeshawn Melucci G, DO  escitalopram (LEXAPRO) 20 MG tablet  Take 20 mg by mouth daily. 04/02/22  Yes [provider]  furosemide (LASIX) 20 MG tablet Take 1 tablet by mouth daily. 04/18/22  Yes [provider]  pantoprazole (PROTONIX) 40 MG tablet Take 40 mg by mouth 2 (two) times daily. 06/21/21  Yes [provider]  thyroid (ARMOUR) 60 MG tablet Take 60 mg by mouth 2 (two) times daily.   Yes [provider]  topiramate (TOPAMAX) 25 MG tablet Take 25 mg by mouth 2 (two) times daily.   Yes [provider]  acetaminophen (TYLENOL) 500 MG tablet Take 1,000 mg by mouth every 6 (six) hours as needed.     [provider]  albuterol (VENTOLIN HFA) 108 (90 Base) MCG/ACT inhaler Inhale 1-2 puffs into the lungs every 4 (four) hours as needed for wheezing or shortness of breath. 01/08/22   Danton Clap, PA-C  cyclobenzaprine (FLEXERIL) 10 MG tablet Take 10 mg by mouth 3 (three) times daily. 05/23/21   [provider]  SUMAtriptan (IMITREX) 50 MG tablet Take 1-2 tablets (50-100 mg total) by mouth once for 1 dose. May repeat in 2 hours if headache persists or recurs. 10/31/20   [provider]    Family History Family History  Problem Relation Age of Onset   Hypertension Mother    Hypertension Father    Cancer Other     Social History Social History   Tobacco Use   Smoking status: Former    Packs/day: 1.00    Types: Cigarettes    Quit date: 03/22/2004    Years since quitting: 18.0   Smokeless tobacco: Never  Vaping Use   Vaping Use: Never used  Substance Use Topics   Alcohol use: No   Drug use: No     Allergies   Hydromorphone, Other, Hydrocodone, Tramadol, Hydrocodone-acetaminophen, Banana, Citrullus vulgaris, Daucus carota, Lidocaine, Miconazole, and Tioconazole   Review of Systems Review of Systems  Constitutional: Negative.   HENT:  Positive for congestion, ear pain and sinus pressure.    Physical Exam Triage Vital Signs ED Triage Vitals  Enc Vitals Group     BP 04/20/22 0818 119/72     Pulse Rate 04/20/22 0818 88     Resp 04/20/22 0818 14     Temp 04/20/22 0818 98.4 F (36.9 C)     Temp Source 04/20/22 0818 Oral     SpO2 04/20/22 0818 100 %     Weight 04/20/22 0814 138 lb 0.1 oz (62.6 kg)     Height 04/20/22 0814 '5\' 9"'$  (1.753 m)     Head Circumference --      Peak Flow --      Pain Score 04/20/22 0814 8     Pain Loc --      Pain Edu? --      Excl. in [place]? --    Updated Vital Signs BP 119/72 (BP Location: Left Arm)   Pulse 88   Temp 98.4 F (36.9 C) (Oral)   Resp 14   Ht '5\' 9"'$  (1.753 m)   Wt 62.6 kg   SpO2 100%   BMI 20.38 kg/m    Visual Acuity Right Eye Distance:   Left Eye Distance:   Bilateral Distance:    Right Eye Near:   Left Eye Near:    Bilateral Near:     Physical Exam Vitals and nursing note reviewed.  Constitutional:      General: She is not in acute distress.    Appearance: Normal appearance.  She is not ill-appearing.  HENT:     Head: Normocephalic and atraumatic.     Right Ear: Tympanic membrane normal.     Left Ear: Tympanic membrane normal.     Nose: Congestion present.     Mouth/Throat:     Pharynx: Posterior oropharyngeal erythema present. No oropharyngeal exudate.  Cardiovascular:     Rate and Rhythm: Normal rate and regular rhythm.  Pulmonary:     Effort: Pulmonary effort is normal.     Breath sounds: Normal breath sounds. No wheezing, rhonchi or rales.  Neurological:     Mental Status: She is alert.  Psychiatric:        Mood and Affect: Mood normal.        Behavior: Behavior normal.      UC Treatments / Results  Labs (all labs ordered are listed, but only abnormal results are displayed) Labs Reviewed - No data to display  EKG   Radiology No results found.  Procedures Procedures (including critical care time)  Medications Ordered in UC Medications - No data to display  Initial Impression / Assessment and Plan / UC Course  I have reviewed the triage vital signs and the nursing notes.  Pertinent labs & imaging results that were available during my care of the patient were reviewed by me and considered in my medical decision making (see chart for details).    41 year old female presents with sinusitis.  Treating with doxycycline given recent use of amoxicillin.  Final Clinical Impressions(s) / UC Diagnoses   Final diagnoses:  Acute maxillary sinusitis, recurrence not specified     Discharge Instructions      Take the medication with food.  Can cause photosensitivity. Avoid prolonged sun exposure while taking this medication.  Take care  Dr.  Lacinda Axon   ED Prescriptions     Medication Sig Dispense Auth. Provider   doxycycline (VIBRA-TABS) 100 MG tablet Take 1 tablet (100 mg total) by mouth 2 (two) times daily. 14 tablet Coral Spikes, DO      PDMP not reviewed this encounter.   Coral Spikes, [place] 04/20/22 [phone]

## 2022-04-20 NOTE — Discharge Instructions (Signed)
Take the medication with food.  Can cause photosensitivity. Avoid prolonged sun exposure while taking this medication.  Take care  Dr. Lacinda Axon

## 2022-04-20 NOTE — ED Triage Notes (Signed)
Patient states that she was treated for a right ear infection with Amoxicillin.  Patient states that both ears started hurting a week ago while she was at the beach.  Patient also reports sinus pain.

## 2022-05-09 ENCOUNTER — Ambulatory Visit
Admission: RE | Admit: 2022-05-09 | Discharge: 2022-05-09 | Disposition: A | Discharge status: Home / Self Care | Payer: Commercial Managed Care - PPO | Source: Ambulatory Visit | Attending: Emergency Medicine | Admitting: Emergency Medicine

## 2022-05-09 ENCOUNTER — Telehealth: Payer: Self-pay | Admitting: Emergency Medicine

## 2022-05-09 VITALS — BP 122/74 | HR 96 | Temp 99.8°F | Resp 14 | Ht 69.0 in | Wt 160.0 lb

## 2022-05-09 DIAGNOSIS — M1990 Unspecified osteoarthritis, unspecified site: Secondary | ICD-10-CM | POA: Insufficient documentation

## 2022-05-09 DIAGNOSIS — E079 Disorder of thyroid, unspecified: Secondary | ICD-10-CM | POA: Insufficient documentation

## 2022-05-09 DIAGNOSIS — U071 COVID-19: Secondary | ICD-10-CM | POA: Insufficient documentation

## 2022-05-09 DIAGNOSIS — F419 Anxiety disorder, unspecified: Secondary | ICD-10-CM | POA: Insufficient documentation

## 2022-05-09 DIAGNOSIS — J45909 Unspecified asthma, uncomplicated: Secondary | ICD-10-CM | POA: Diagnosis present

## 2022-05-09 DIAGNOSIS — B349 Viral infection, unspecified: Secondary | ICD-10-CM | POA: Diagnosis not present

## 2022-05-09 DIAGNOSIS — E119 Type 2 diabetes mellitus without complications: Secondary | ICD-10-CM | POA: Diagnosis not present

## 2022-05-09 DIAGNOSIS — Z8616 Personal history of COVID-19: Secondary | ICD-10-CM | POA: Insufficient documentation

## 2022-05-09 LAB — RESP PANEL BY RT-PCR (FLU A&B, COVID) ARPGX2
Influenza A by PCR: NEGATIVE
Influenza B by PCR: NEGATIVE
SARS Coronavirus 2 by RT PCR: POSITIVE — AB

## 2022-05-09 MED ORDER — MELOXICAM 7.5 MG PO TABS: 7.5000 mg | ORAL_TABLET | Freq: Every day | ORAL | 0 refills | Status: DC

## 2022-05-09 MED ORDER — MOLNUPIRAVIR EUA 200MG CAPSULE: 4.0000 | ORAL_CAPSULE | Freq: Two times a day (BID) | ORAL | 0 refills | Status: AC

## 2022-05-09 MED ORDER — CYCLOBENZAPRINE HCL 5 MG PO TABS: 5.0000 mg | ORAL_TABLET | Freq: Three times a day (TID) | ORAL | 0 refills | Status: AC | PRN

## 2022-05-09 NOTE — ED Triage Notes (Signed)
Patient c/o bodyaches that started on Wed.  Patient states that she got off a cruise ship today.  Patient c/o cough, congestion that started on Wed. Patient also report 102 fever.

## 2022-05-09 NOTE — Telephone Encounter (Signed)
Notified of positive COVID results, negative for the flu, antiviral medication sent to pharmacy and discussed administration, discussed quarantine guidelines and given work note, may follow-up with urgent care as needed

## 2022-05-09 NOTE — Discharge Instructions (Signed)
Your symptoms today are most likely being caused by a virus and should steadily improve in time it can take up to 7 to 10 days before you truly start to see a turnaround however things will get better  COVID and flu test are pending, you will be called with results, if positive you will receive antiviral medicine for either illness, if negative this is a routine virus and we must give it time for your body to fix itself  If COVID and flu testing are negative and you do not begin to see any improvement in your symptoms by day 8 which is Wednesday, May 14, 2022 you may go to the pharmacy and begin use of Augmentin every morning and every evening for 7 days, all additional medication will be sent in at notification of test results    You can take Tylenol and/or Ibuprofen as needed for fever reduction and pain relief.   For cough: honey 1/2 to 1 teaspoon (you can dilute the honey in water or another fluid).  You can also use guaifenesin and dextromethorphan for cough. You can use a humidifier for chest congestion and cough.  If you don't have a humidifier, you can sit in the bathroom with the hot shower running.     You may use muscle relaxer every 8 hours as needed to help with body pain, be mindful this medication may make you drowsy  May use meloxicam every morning to help with pain, you may take Tylenol 500 to 1000 mg every 6 hours throughout the day for additional comfort   For sore throat: try warm salt water gargles, cepacol lozenges, throat spray, warm tea or water with lemon/honey, popsicles or ice, or OTC cold relief medicine for throat discomfort.   For congestion: take a daily anti-histamine like Zyrtec, Claritin, and a oral decongestant, such as pseudoephedrine.  You can also use Flonase 1-2 sprays in each nostril daily.   It is important to stay hydrated: drink plenty of fluids (water, gatorade/powerade/pedialyte, juices, or teas) to keep your throat moisturized and help further  relieve irritation/discomfort.

## 2022-05-09 NOTE — ED Provider Notes (Signed)
[facility]    CSN: [id number] Arrival date & time: 05/09/22  1736      History   Chief Complaint Chief Complaint  Patient presents with   Generalized Body Aches   Fever   Cough    HPI Sandra Sanders is a 41 y.o. female.   Patient presents with fevers, chills, body aches, nasal congestion, rhinorrhea, sinus pain and pressure, bilateral ear pain and fullness, productive cough with brown sputum, shortness of breath at rest worsened by exertion and intermittent wheezing for 2 days.  Fever peaking at 102.9. endorses that she return from a cruise today.  No known sick contacts.  Decreased appetite, tolerating fluids.  Has attempted use of Mucinex which has been ineffective.  Mucinex, inhlaer, nyquil .  History of asthma, diabetes, thyroid disease, arthritis, anxiety.  Patient endorses that she has had a ongoing sinus infection since June 2023 and has attempted use of 3 different antibiotics for treatment without improvement of symptoms.   Past Medical History:  Diagnosis Date   Anemia    Anxiety    Arthritis    Asthma    Cancer ([place])    Cervical Cancer   COVID-19 09/2019   Diabetes mellitus without complication (HCC)    GERD (gastroesophageal reflux disease)    Headache    PFO (patent foramen ovale)    as an infant   Thyroid disease     Patient Active Problem List   Diagnosis Date Noted   Hx MRSA infection 01/08/2022   Pain in joint of right ankle 07/09/2021   Contusion of right ankle 07/09/2021   Muscle strain of lower extremity 06/27/2021   Migraine 06/27/2021   Non-seasonal allergic rhinitis 01/22/2021   Epigastric pain 12/27/2020   B12 deficiency 09/23/2018   Degenerative disc disease at L5-S1 level 12/30/2017   Gastroesophageal reflux disease 05/26/2017   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   Hematochezia 11/19/2016   Morbid obesity ([place]) 04/22/2016   Iron deficiency 32/95/1884   Helicobacter pylori  gastrointestinal tract infection 01/18/2016   Vitamin D deficiency 01/18/2016   Fatigue 12/18/2015   Malaise 12/18/2015   Snoring 12/18/2015   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/60/6301   Thyroid disease 02/23/2014   Syncope 12/09/2013   Insomnia 09/19/2013   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      Home Medications    Prior to Admission medications   Medication Sig Start Date End Date Taking? Authorizing Provider  albuterol (VENTOLIN HFA) 108 (90 Base) MCG/ACT inhaler Inhale 1-2 puffs into the lungs every 4 (four) hours as needed for wheezing or shortness of breath. 01/08/22  Yes Danton Clap, PA-C  ALPRAZolam Duanne Moron) 0.5 MG tablet Take 0.5-1 mg by mouth 3 (three) times daily as needed. 06/03/21  Yes [provider]  amphetamine-dextroamphetamine (ADDERALL) 20 MG  tablet Take 20 mg by mouth 4 (four) times daily. 06/14/21  Yes [provider]  aspirin EC 81 MG tablet Take 81 mg by mouth daily. Swallow whole.   Yes [provider]  clobetasol cream (TEMOVATE) 4.56 % 1(ONE) APPLICATION(S) TOPICAL 2(TWO) TIMES A DAY 10/25/21  Yes [provider]  escitalopram (LEXAPRO) 20 MG tablet Take 20 mg by mouth daily. 04/02/22  Yes [provider]  furosemide (LASIX) 20 MG tablet Take 1 tablet by mouth daily. 04/18/22  Yes [provider]  gabapentin (NEURONTIN) 600  MG tablet Take 600 mg by mouth 2 (two) times daily. 03/09/22  Yes [provider]  pantoprazole (PROTONIX) 40 MG tablet Take 40 mg by mouth 2 (two) times daily. 06/21/21  Yes [provider]  pregabalin (LYRICA) 75 MG capsule Take 75 mg by mouth 2 (two) times daily. 04/09/22  Yes [provider]  SUMAtriptan (IMITREX) 50 MG tablet Take 1-2 tablets (50-100 mg total) by mouth once for 1 dose. May repeat in 2 hours if headache persists or recurs. 10/31/20  Yes [provider]  thyroid (ARMOUR) 60 MG tablet Take 60 mg by mouth 2 (two) times daily.   Yes [provider]  topiramate (TOPAMAX) 25 MG tablet Take 25 mg by mouth 2 (two) times daily.   Yes [provider]  acetaminophen (TYLENOL) 500 MG tablet Take 1,000 mg by mouth every 6 (six) hours as needed.    [provider]  cyclobenzaprine (FLEXERIL) 10 MG tablet Take 10 mg by mouth 3 (three) times daily. 05/23/21   [provider]  doxycycline (VIBRA-TABS) 100 MG tablet Take 1 tablet (100 mg total) by mouth 2 (two) times daily. 04/20/22   Coral Spikes, DO    Family History Family History  Problem Relation Age of Onset   Hypertension Mother    Hypertension Father    Cancer Other     Social History Social History   Tobacco Use   Smoking status: Former    Packs/day: 1.00    Types: Cigarettes    Quit date: 03/22/2004    Years since quitting: 18.1   Smokeless tobacco: Never  Vaping Use   Vaping Use: Never used  Substance Use Topics   Alcohol use: No   Drug use: No     Allergies   Hydromorphone, Other, Hydrocodone, Tramadol, Hydrocodone-acetaminophen, Banana, Citrullus vulgaris, Daucus carota, Lidocaine, Miconazole, and Tioconazole   Review of Systems Review of Systems Defer to HPI   Physical Exam Triage Vital Signs ED Triage Vitals  Enc Vitals Group     BP 05/09/22 1750 122/74     Pulse Rate 05/09/22 1750 96     Resp 05/09/22 1750 14     Temp 05/09/22 1750  99.8 F (37.7 C)     Temp Source 05/09/22 1750 Oral     SpO2 05/09/22 1750 100 %     Weight 05/09/22 1746 160 lb (72.6 kg)     Height 05/09/22 1746 '5\' 9"'$  (1.753 m)     Head Circumference --      Peak Flow --      Pain Score 05/09/22 1746 10     Pain Loc --      Pain Edu? --      Excl. in [place]? --    No data found.  Updated Vital Signs BP 122/74 (BP Location: Left Arm)   Pulse 96   Temp 99.8 F (37.7 C) (Oral)   Resp 14  Ht '5\' 9"'$  (1.753 m)   Wt 160 lb (72.6 kg)   SpO2 100%   BMI 23.63 kg/m   Visual Acuity Right Eye Distance:   Left Eye Distance:   Bilateral Distance:    Right Eye Near:   Left Eye Near:    Bilateral Near:     Physical Exam Constitutional:      Appearance: She is ill-appearing.  HENT:     Head: Normocephalic.     Right Ear: Hearing, ear canal and external ear normal. A middle ear effusion is present.     Left Ear: Hearing, ear canal and external ear normal. A middle ear effusion is present.     Nose: Congestion and rhinorrhea present.     Right Sinus: Maxillary sinus tenderness and frontal sinus tenderness present.     Left Sinus: No maxillary sinus tenderness or frontal sinus tenderness.     Mouth/Throat:     Mouth: Mucous membranes are moist.     Pharynx: Oropharynx is clear. No posterior oropharyngeal erythema.  Eyes:     Extraocular Movements: Extraocular movements intact.  Cardiovascular:     Rate and Rhythm: Normal rate and regular rhythm.     Pulses: Normal pulses.     Heart sounds: Normal heart sounds.  Pulmonary:     Effort: Pulmonary effort is normal.     Breath sounds: Normal breath sounds.  Musculoskeletal:     Cervical back: Normal range of motion and neck supple.  Skin:    General: Skin is warm and dry.  Neurological:     Mental Status: She is alert and oriented to person, place, and time. Mental status is at baseline.  Psychiatric:        Mood and Affect: Mood normal.        Behavior: Behavior normal.      UC  Treatments / Results  Labs (all labs ordered are listed, but only abnormal results are displayed) Labs Reviewed  RESP PANEL BY RT-PCR (FLU A&B, COVID) [id number]    EKG   Radiology No results found.  Procedures Procedures (including critical care time)  Medications Ordered in UC Medications - No data to display  Initial Impression / Assessment and Plan / UC Course  I have reviewed the triage vital signs and the nursing notes.  Pertinent labs & imaging results that were available during my care of the patient were reviewed by me and considered in my medical decision making (see chart for details).  Viral illness  Vital signs are stable, and well ill-appearing patient is in no signs of distress, COVID and flu test are pending, if positive qualifies for antivirals, discussed with patient at time of notification, if negative watch what antibiotic will be placed at pharmacy if symptoms have shown no signs of improvement after at least 7 days, Flexeril and meloxicam have been prescribed as the body aches and most worrisome symptoms, may use over-the-counter Tylenol in addition to over-the-counter medications for additional supportive care for remaining symptoms, may follow-up with urgent care as needed if symptoms persist or worsen past use of all medications Final Clinical Impressions(s) / UC Diagnoses   Final diagnoses:  None   Discharge Instructions   None    ED Prescriptions   None    PDMP not reviewed this encounter.   Hans Eden, NP 05/09/22 1906

## 2023-01-27 IMAGING — US US EXTREM LOW VENOUS
1 series · 14 of 24 positions shown · non-contrast
Comparison: None Available.

CLINICAL DATA: Chest pain on exertion.  Lower extremity edema.

EXAM:
BILATERAL LOWER EXTREMITY VENOUS DOPPLER ULTRASOUND
TECHNIQUE: Gray-scale sonography with compression, as well as color and duplex
ultrasound, were performed to evaluate the deep venous system(s)
from the level of the common femoral vein through the popliteal and
proximal calf veins.

[Series 1: us venous img lower bilat (dvt) · portal-venous · 14 of 67 slices shown]
[im 1/67]
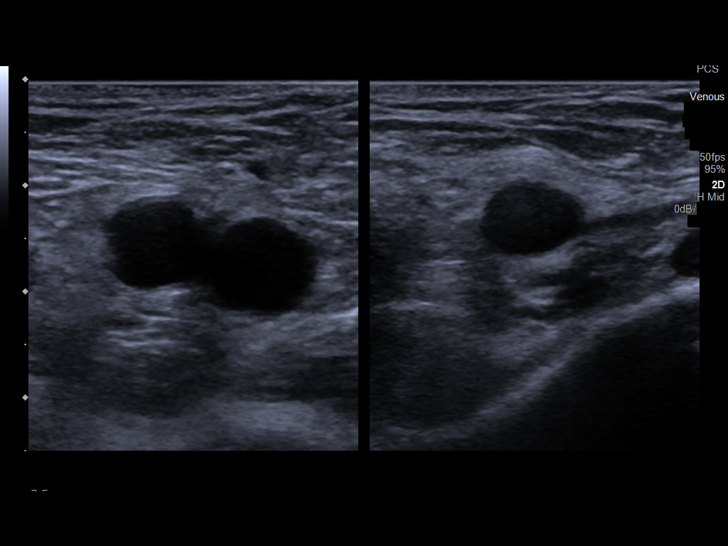
[im 6/67]
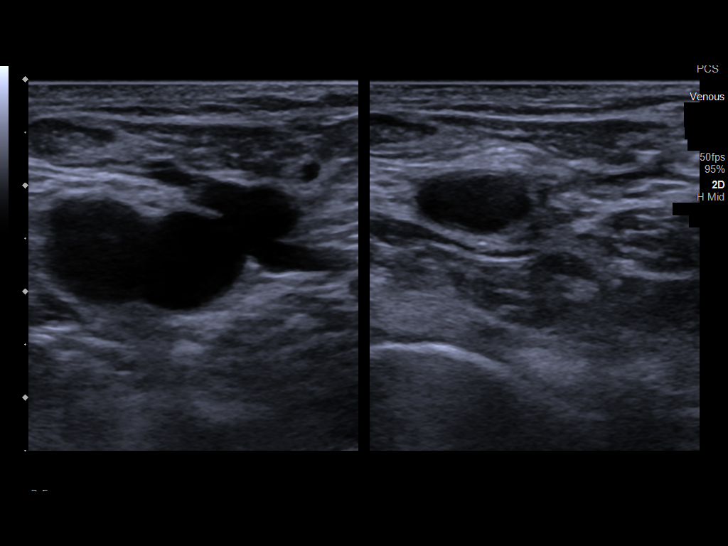
[im 12/67]
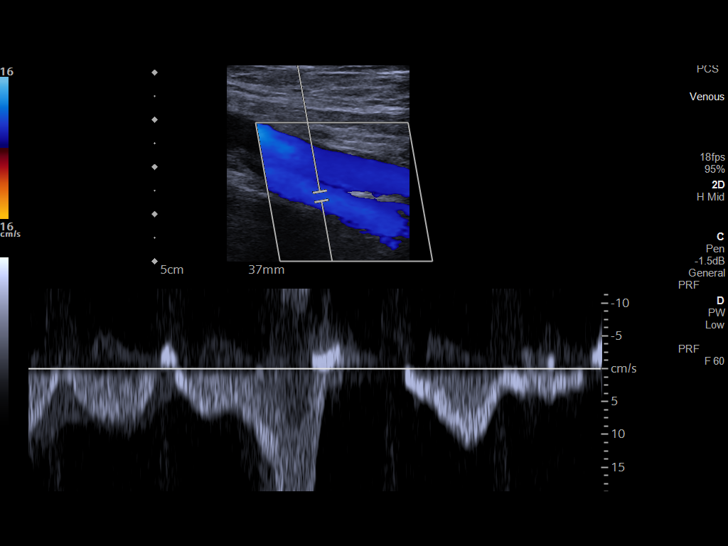
[im 18/67]
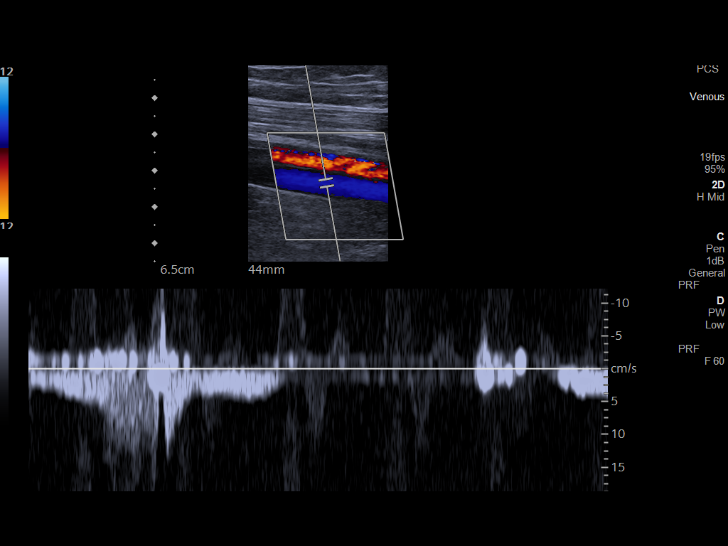
[im 21/67]
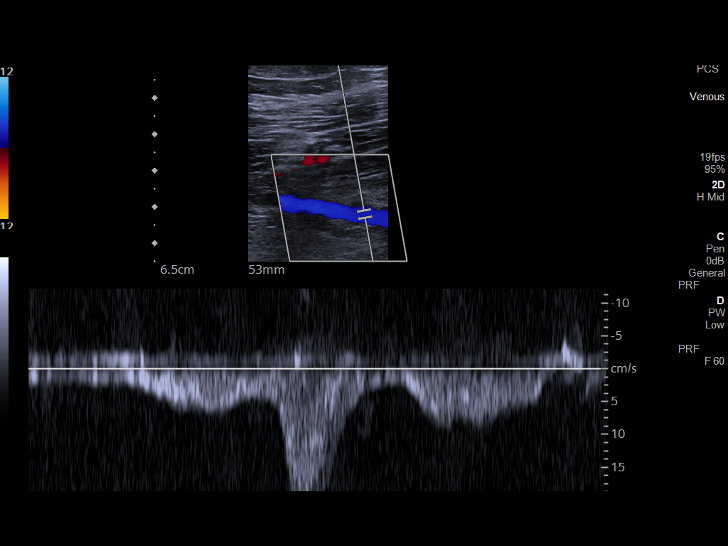
[im 26/67]
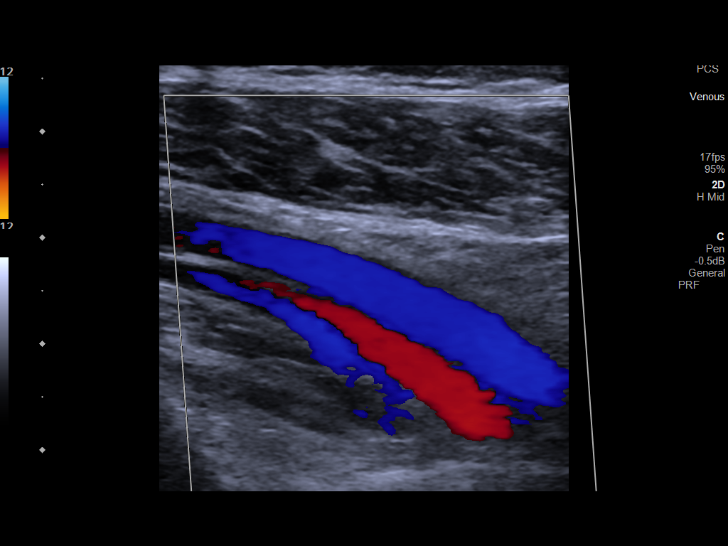
[im 32/67]
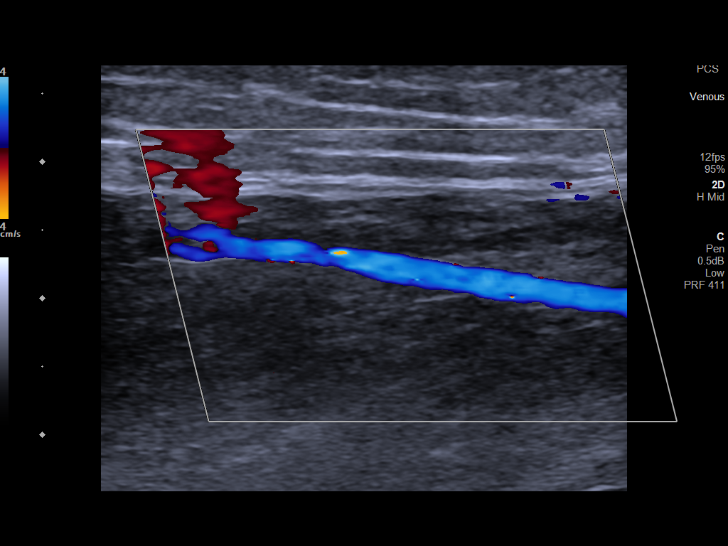
[im 35/67]
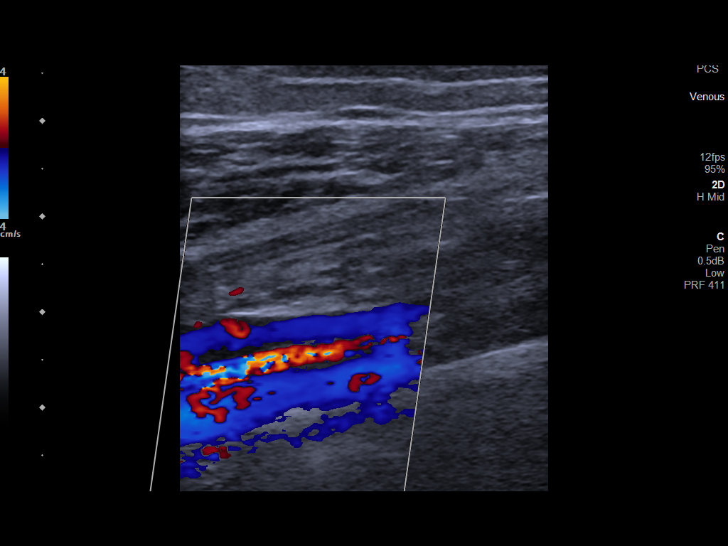
[im 41/67]
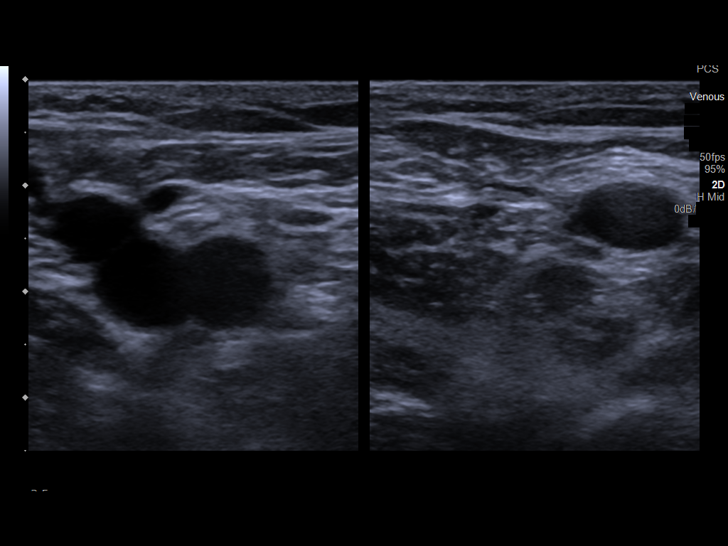
[im 46/67]
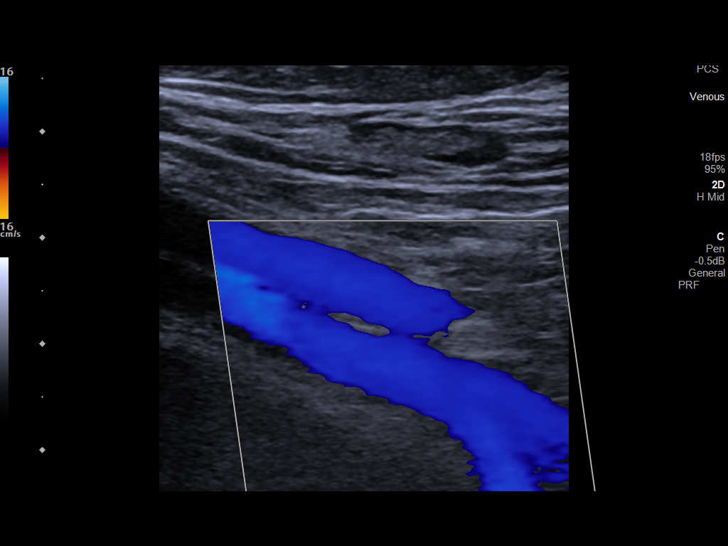
[im 52/67]
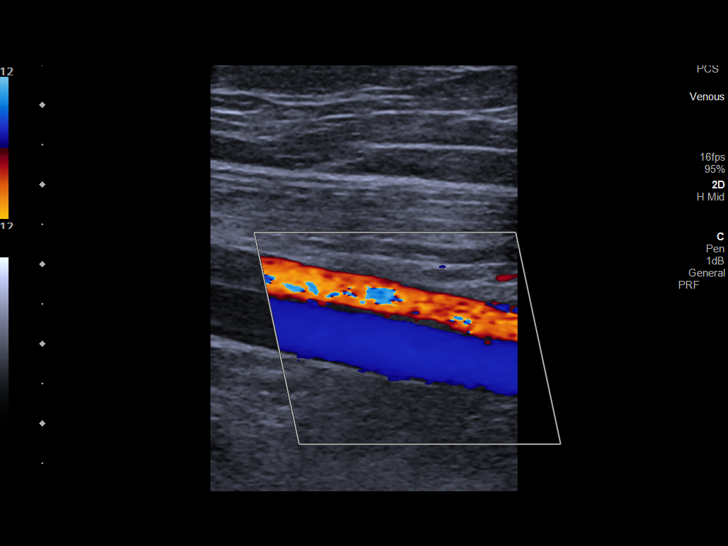
[im 55/67]
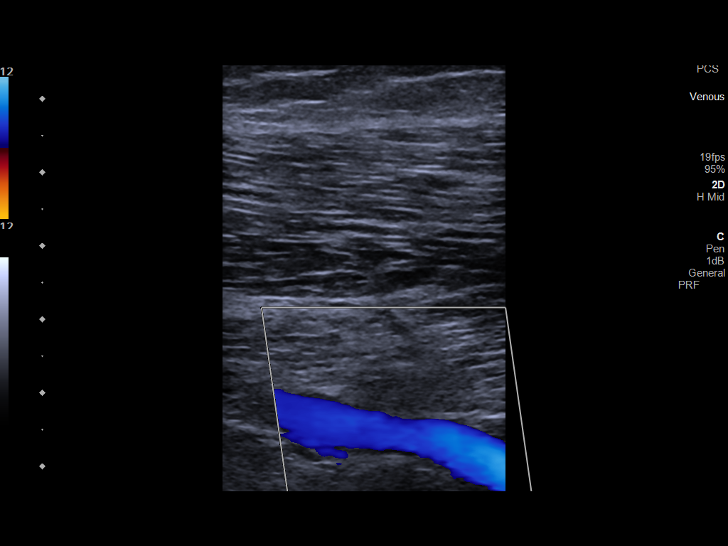
[im 61/67]
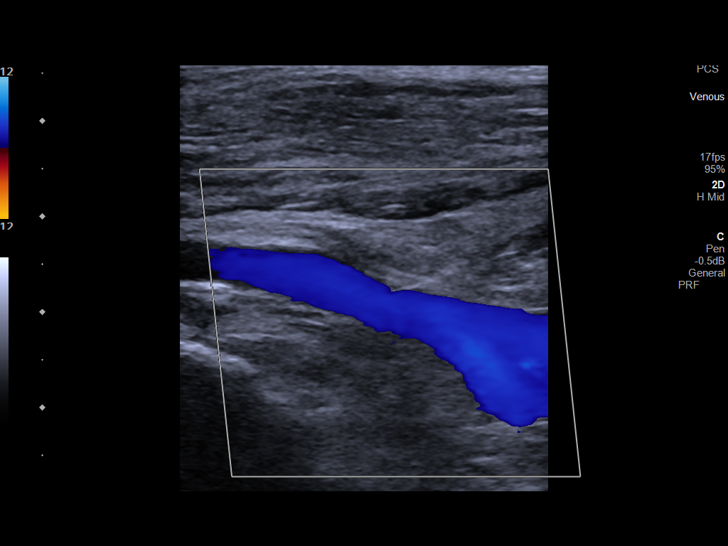
[im 67/67]
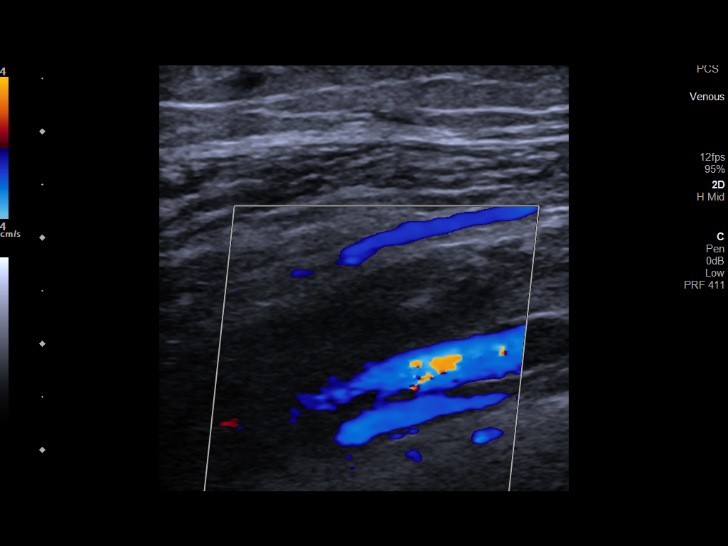

[14 of 24 positions shown; findings below may reference images not displayed]

FINDINGS: VENOUS

Normal compressibility of the common femoral, superficial femoral,
and popliteal veins, as well as the visualized calf veins.
Visualized portions of profunda femoral vein and great saphenous
vein unremarkable. No filling defects to suggest DVT on grayscale or
color Doppler imaging. Doppler waveforms show normal direction of
venous flow, normal respiratory plasticity and response to
augmentation.

Limited views of the contralateral common femoral vein are
unremarkable.

OTHER

None.

Limitations: none
IMPRESSION: Negative exam.

## 2023-02-10 ENCOUNTER — Ambulatory Visit
Admission: EM | Admit: 2023-02-10 | Discharge: 2023-02-10 | Disposition: A | Discharge status: Home / Self Care | Payer: Commercial Managed Care - PPO

## 2023-02-10 DIAGNOSIS — H0015 Chalazion left lower eyelid: Secondary | ICD-10-CM | POA: Diagnosis not present

## 2023-02-10 DIAGNOSIS — B349 Viral infection, unspecified: Secondary | ICD-10-CM | POA: Diagnosis not present

## 2023-02-10 DIAGNOSIS — H9203 Otalgia, bilateral: Secondary | ICD-10-CM

## 2023-02-10 NOTE — ED Triage Notes (Signed)
Patient states that over the weekend she became to have some sort of bump in her left eye. States she seen a virtual doctor because she thought it was a stye. She was told she needed to see an actual doctor. Patient also c/o ear and facial pain.

## 2023-02-10 NOTE — Discharge Instructions (Addendum)
Please follow up with your eye doctor, Clydene Pugh, for further evaluation of left eye issues-call for appt. May take otc meds for viral symptom management: probiotic, allergy med of choice,flonase nasal spray. Please follow up with PCP. Return to urgent care as needed.

## 2023-02-10 NOTE — ED Provider Notes (Signed)
[facility]    CSN: [id number] Arrival date & time: 02/10/23  4696      History   Chief Complaint Chief Complaint  Patient presents with   Otalgia    HPI Sandra Sanders is a 42 y.o. female.   42 year old female, Sandra Sanders, presents to urgent care chief complaint of bump in her left eye as well as ear and facial pain.  Patient had a virtual visit and was diagnosed with stye of her left eye. Pt also reports diarrhea last night.  The history is provided by the patient. No language interpreter was used.    Past Medical History:  Diagnosis Date   Anemia    Anxiety    Arthritis    Asthma    Cancer (HCC)    Cervical Cancer   COVID-19 09/2019   Diabetes mellitus without complication (HCC)    GERD (gastroesophageal reflux disease)    Headache    PFO (patent foramen ovale)    as an infant   Thyroid disease     Patient Active Problem List   Diagnosis Date Noted   Chalazion of left lower eyelid 02/10/2023   Viral illness 02/10/2023   Otalgia of both ears 02/10/2023   Hx MRSA infection 01/08/2022   Pain in joint of right ankle 07/09/2021   Contusion of right ankle 07/09/2021   Muscle strain of lower extremity 06/27/2021   Migraine 06/27/2021   Non-seasonal allergic rhinitis 01/22/2021   Epigastric pain 12/27/2020   B12 deficiency 09/23/2018   Degenerative disc disease at L5-S1 level 12/30/2017   Gastroesophageal reflux disease 05/26/2017   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   Hematochezia 11/19/2016   Morbid obesity (HCC) 04/22/2016   Iron deficiency 03/18/2016   Helicobacter pylori gastrointestinal tract infection 01/18/2016   Vitamin D deficiency 01/18/2016   Fatigue 12/18/2015   Malaise 12/18/2015   Snoring 12/18/2015   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   Thyroid disease 02/23/2014   Syncope 12/09/2013   Insomnia 09/19/2013   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      Home Medications    Prior to Admission medications   Medication Sig Start Date End Date Taking? Authorizing Provider  acetaminophen (TYLENOL) 500 MG tablet Take 1,000 mg by mouth every 6 (six) hours as needed.    [provider]  albuterol (VENTOLIN HFA) 108 (90 Base) MCG/ACT inhaler Inhale 1-2 puffs into the lungs every 4 (four) hours as needed for wheezing or shortness of breath. 01/08/22   Shirlee Latch, PA-C  ALPRAZolam Prudy Feeler) 0.5 MG tablet Take 0.5-1 mg by mouth 3 (three) times daily as needed. 06/03/21   [provider]  amphetamine-dextroamphetamine (ADDERALL) 20 MG tablet Take 20 mg by mouth 4 (four) times daily. 06/14/21   [provider]  aspirin EC 81  MG tablet Take 81 mg by mouth daily. Swallow whole.    [provider]  clobetasol cream (TEMOVATE) 0.05 % 1(ONE) APPLICATION(S) TOPICAL 2(TWO) TIMES A DAY 10/25/21   [provider]  cyclobenzaprine (FLEXERIL) 5 MG tablet Take 1 tablet (5 mg total) by mouth 3 (three) times daily as needed for muscle spasms. 05/09/22   White, Elita Boone, NP  escitalopram (LEXAPRO) 20 MG tablet Take 20 mg by mouth daily. 04/02/22   [provider]  furosemide (LASIX) 20 MG tablet Take 1 tablet by mouth daily. 04/18/22   [provider]  gabapentin (NEURONTIN) 600 MG tablet Take 600 mg by mouth 2 (two) times daily. 03/09/22   [provider]  meloxicam (MOBIC) 7.5 MG tablet Take 1 tablet (7.5 mg total) by mouth daily. 05/09/22   White, Elita Boone, NP  pantoprazole (PROTONIX) 40 MG tablet Take 40 mg by mouth 2 (two) times daily. 06/21/21   [provider]  pregabalin (LYRICA) 75 MG capsule Take 75 mg by mouth 2 (two) times daily. 04/09/22   [provider]  SUMAtriptan (IMITREX) 50 MG tablet Take 1-2 tablets (50-100 mg total) by mouth once for 1 dose. May repeat in 2 hours if headache persists or recurs. 10/31/20   [provider]  thyroid (ARMOUR) 60 MG tablet Take 60 mg by mouth 2 (two) times daily.    [provider]  topiramate (TOPAMAX) 25 MG tablet Take 25 mg by mouth 2 (two) times daily.    [provider]    Family History Family History  Problem Relation Age of Onset   Hypertension Mother    Hypertension Father    Cancer Other     Social History Social History   Tobacco Use   Smoking status: Former    Packs/day: 1    Types: Cigarettes    Quit date: 03/22/2004    Years since quitting: 18.9   Smokeless tobacco: Never  Vaping Use   Vaping Use: Never used  Substance Use Topics   Alcohol use: No   Drug use: No     Allergies   Hydromorphone, Other, Hydrocodone, Tramadol, Hydrocodone-acetaminophen, Banana, Citrullus vulgaris, Daucus carota, Lidocaine, Miconazole, and Tioconazole   Review of Systems Review of Systems  Constitutional:  Negative for fever.  HENT:  Positive for ear pain, sinus pressure and sinus pain.   Eyes:  Positive for pain. Negative for photophobia, discharge, redness, itching and visual disturbance.  Gastrointestinal:  Positive for diarrhea.  All other systems reviewed and are negative.    Physical Exam Triage Vital Signs ED Triage Vitals  Enc Vitals Group     BP 02/10/23 0846 94/74     Pulse Rate 02/10/23 0846 78     Resp 02/10/23 0846 16     Temp 02/10/23 0846 97.9 F (36.6 C)     Temp Source 02/10/23 0846 Oral     SpO2  02/10/23 0846 100 %     Weight --      Height --      Head Circumference --      Peak Flow --      Pain Score 02/10/23 0847 7     Pain Loc --      Pain Edu? --      Excl. in [place]? --    No data found.  Updated Vital Signs BP 94/74 (BP Location: Right Arm)   Pulse 78   Temp 97.9 F (36.6 C) (Oral)   Resp 16  SpO2 100%   Visual Acuity Right Eye Distance:   Left Eye Distance:   Bilateral Distance:    Right Eye Near:   Left Eye Near:    Bilateral Near:     Physical Exam Vitals and nursing note reviewed.  Constitutional:      General: She is not in acute distress.    Appearance: She is well-developed and well-groomed.  HENT:     Head: Normocephalic and atraumatic.     Right Ear: Tympanic membrane is retracted.     Left Ear: Tympanic membrane is retracted.     Nose: Congestion present.     Mouth/Throat:     Lips: Pink.     Mouth: Mucous membranes are moist.     Pharynx: Oropharynx is clear.  Eyes:     General: Lids are normal. Vision grossly intact.     Extraocular Movements: Extraocular movements intact.     Conjunctiva/sclera: Conjunctivae normal.     Pupils: Pupils are equal, round, and reactive to light.   Neck:     Trachea: Trachea normal.  Cardiovascular:     Rate and Rhythm: Normal rate and regular rhythm.     Pulses: Normal pulses.     Heart sounds: Normal heart sounds. No murmur heard. Pulmonary:     Effort: Pulmonary effort is normal. No respiratory distress.     Breath sounds: Normal breath sounds and air entry.  Abdominal:     Palpations: Abdomen is soft.     Tenderness: There is no abdominal tenderness.  Musculoskeletal:        General: No swelling.     Cervical back: Normal range of motion and neck supple.  Skin:    General: Skin is warm and dry.     Capillary Refill: Capillary refill takes less than 2 seconds.  Neurological:     General: No focal deficit present.     Mental Status: She is alert and oriented to person, place, and time.      GCS: GCS eye subscore is 4. GCS verbal subscore is 5. GCS motor subscore is 6.  Psychiatric:        Attention and Perception: Attention normal.        Mood and Affect: Mood normal.        Speech: Speech normal.        Behavior: Behavior normal. Behavior is cooperative.      UC Treatments / Results  Labs (all labs ordered are listed, but only abnormal results are displayed) Labs Reviewed - No data to display  EKG   Radiology No results found.  Procedures Procedures (including critical care time)  Medications Ordered in UC Medications - No data to display  Initial Impression / Assessment and Plan / UC Course  I have reviewed the triage vital signs and the nursing notes.  Pertinent labs & imaging results that were available during my care of the patient were reviewed by me and considered in my medical decision making (see chart for details).    DDX: Pyogenic granuloma vs, chalazion, vs hordeolum, Viral illness  Final Clinical Impressions(s) / UC Diagnoses   Final diagnoses:  Chalazion of left lower eyelid  Viral illness  Otalgia of both ears     Discharge Instructions      Please follow up with your eye doctor, Clydene Pugh, for further evaluation of left eye issues-call for appt. May take otc meds for viral symptom management: probiotic, allergy med of choice,flonase nasal spray. Please follow up with  PCP. Return to urgent care as needed.      ED Prescriptions   None    PDMP not reviewed this encounter.   Clancy Gourd, NP 02/10/23 [phone]

## 2023-03-17 ENCOUNTER — Ambulatory Visit
Admission: EM | Admit: 2023-03-17 | Discharge: 2023-03-17 | Disposition: A | Discharge status: Home / Self Care | Payer: Commercial Managed Care - PPO | Attending: Emergency Medicine | Admitting: Emergency Medicine

## 2023-03-17 ENCOUNTER — Encounter: Payer: Self-pay | Admitting: Emergency Medicine

## 2023-03-17 DIAGNOSIS — G43011 Migraine without aura, intractable, with status migrainosus: Secondary | ICD-10-CM | POA: Diagnosis not present

## 2023-03-17 MED ORDER — DEXAMETHASONE SODIUM PHOSPHATE 10 MG/ML IJ SOLN
10.0000 mg | Freq: Once | INTRAMUSCULAR | Status: AC
  Administered 2023-03-17: 10 mg via INTRAMUSCULAR

## 2023-03-17 MED ORDER — ONDANSETRON 8 MG PO TBDP
8.0000 mg | ORAL_TABLET | Freq: Once | ORAL | Status: AC
  Administered 2023-03-17: 8 mg via ORAL

## 2023-03-17 NOTE — ED Triage Notes (Signed)
Pt c/o migraine x 4 days. Pt states her migraine medication and OTC pain medication is not working. She also has sinus pressure and pain x 2 weeks. She was seen at [facility] on 03/02/23 and prescribed azithromycin 250mg  for 30 days.

## 2023-03-17 NOTE — Discharge Instructions (Addendum)
Go home and rest in a cool dark environment.  Use over-the-counter ibuprofen or Aleve as needed for mild to moderate pain.  You may use your Imitrex as prescribed again tomorrow.  If your headache does not resolve, or worsens, please go to the ER for evaluation.

## 2023-03-17 NOTE — ED Provider Notes (Addendum)
[facility]    CSN: [id number] Arrival date & time: 03/17/23  1625      History   Chief Complaint Chief Complaint  Patient presents with   Migraine   Sinus pressure    HPI Sandra Sanders is a 42 y.o. female.   HPI  42 year old female with a history of chronic sinusitis and migraine headaches presents for evaluation of 4 days of frontal migraine with associated light sensitivity.  She reports that this is her typical pattern for her migraine and there is nothing different other than the fact that over-the-counter analgesia and her Imitrex that she is prescribed have not relieved the pain.  She is also complaining of sinus pressure and pain has been going for the past 2 weeks.  She is currently being followed by [facility] and was prescribed azithromycin 250 mg once daily for 30 days on 03/02/2023.  The plan is to have a CT scan of her sinuses and follow-up with a sinus specialist after that she completes antibiotic therapy.  Past Medical History:  Diagnosis Date   Anemia    Anxiety    Arthritis    Asthma    Cancer (HCC)    Cervical Cancer   COVID-19 09/2019   Diabetes mellitus without complication (HCC)    GERD (gastroesophageal reflux disease)    Headache    PFO (patent foramen ovale)    as an infant   Thyroid disease     Patient Active Problem List   Diagnosis Date Noted   Chalazion of left lower eyelid 02/10/2023   Viral illness 02/10/2023   Otalgia of both ears 02/10/2023   Hx MRSA infection 01/08/2022   Pain in joint of right ankle 07/09/2021   Contusion of right ankle 07/09/2021   Muscle strain of lower extremity 06/27/2021   Migraine 06/27/2021   Non-seasonal allergic rhinitis 01/22/2021   Epigastric pain 12/27/2020   B12 deficiency 09/23/2018   Degenerative disc disease at L5-S1 level 12/30/2017   Gastroesophageal reflux disease 05/26/2017   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   Hematochezia  11/19/2016   Morbid obesity (HCC) 04/22/2016   Iron deficiency 03/18/2016   Helicobacter pylori gastrointestinal tract infection 01/18/2016   Vitamin D deficiency 01/18/2016   Fatigue 12/18/2015   Malaise 12/18/2015   Snoring 12/18/2015   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   Thyroid disease 02/23/2014   Syncope 12/09/2013   Insomnia 09/19/2013   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      Home Medications    Prior to Admission medications   Medication Sig Start Date End Date Taking? Authorizing Provider  acetaminophen (TYLENOL) 500 MG tablet Take 1,000 mg by mouth every 6 (six) hours as needed.    [provider]  albuterol (VENTOLIN HFA) 108 (90 Base) MCG/ACT inhaler Inhale 1-2  puffs into the lungs every 4 (four) hours as needed for wheezing or shortness of breath. 01/08/22   Shirlee Latch, PA-C  ALPRAZolam Prudy Feeler) 0.5 MG tablet Take 0.5-1 mg by mouth 3 (three) times daily as needed. 06/03/21   [provider]  amphetamine-dextroamphetamine (ADDERALL) 20 MG tablet Take 20 mg by mouth 4 (four) times daily. 06/14/21   [provider]  aspirin EC 81 MG tablet Take 81 mg by mouth daily. Swallow whole.    [provider]  clobetasol cream (TEMOVATE) 0.05 % 1(ONE) APPLICATION(S) TOPICAL 2(TWO) TIMES A DAY 10/25/21   [provider]  cyclobenzaprine (FLEXERIL)  5 MG tablet Take 1 tablet (5 mg total) by mouth 3 (three) times daily as needed for muscle spasms. 05/09/22   White, Elita Boone, NP  escitalopram (LEXAPRO) 20 MG tablet Take 20 mg by mouth daily. 04/02/22   [provider]  furosemide (LASIX) 20 MG tablet Take 1 tablet by mouth daily. 04/18/22   [provider]  gabapentin (NEURONTIN) 600 MG tablet Take 600 mg by mouth 2 (two) times daily. 03/09/22   [provider]  meloxicam (MOBIC) 7.5 MG tablet Take 1 tablet (7.5 mg total) by mouth daily. 05/09/22   White, Elita Boone, NP  pantoprazole (PROTONIX) 40 MG tablet Take 40 mg by mouth 2 (two) times daily. 06/21/21   [provider]  pregabalin (LYRICA) 75 MG capsule Take 75 mg by mouth 2 (two) times daily. 04/09/22   [provider]  SUMAtriptan (IMITREX) 50 MG tablet Take 1-2 tablets (50-100 mg total) by mouth once for 1 dose. May repeat in 2 hours if headache persists or recurs. 10/31/20   [provider]  thyroid (ARMOUR) 60 MG tablet Take 60 mg by mouth 2 (two) times daily.    [provider]  topiramate (TOPAMAX) 25 MG tablet Take 25 mg by mouth 2 (two) times daily.    [provider]    Family History Family History  Problem Relation Age of Onset   Hypertension Mother    Hypertension Father    Cancer Other     Social History Social History   Tobacco Use   Smoking status: Former    Packs/day: 1    Types: Cigarettes    Quit date: 03/22/2004    Years since quitting: 18.9   Smokeless tobacco: Never  Vaping Use   Vaping Use: Never used  Substance Use Topics   Alcohol use: No   Drug use: No     Allergies   Hydromorphone, Hydromorphone hcl, Other, Hydrocodone, Tramadol, Hydrocodone-acetaminophen, Banana, Citrullus vulgaris, Daucus carota, Lidocaine, Miconazole, and Tioconazole   Review of Systems Review of Systems  Eyes:  Positive for photophobia. Negative for visual disturbance.  Gastrointestinal:  Negative for  nausea and vomiting.  Neurological:  Positive for headaches.     Physical Exam Triage Vital Signs ED Triage Vitals [03/17/23 1632]  Enc Vitals Group     BP      Pulse      Resp      Temp      Temp src      SpO2      Weight      Height      Head Circumference      Peak Flow      Pain Score 10     Pain Loc      Pain Edu?      Excl. in GC?    No data found.  Updated Vital Signs BP 122/82 (BP Location: Left Arm)   Pulse 83   Temp 98.6 F (37 C) (Oral)   Resp 16   SpO2 99%   Visual Acuity Right Eye Distance:   Left Eye Distance:   Bilateral Distance:    Right Eye Near:   Left Eye Near:    Bilateral Near:     Physical Exam Vitals and nursing note reviewed.  Constitutional:      Appearance: Normal appearance. She is not ill-appearing.  HENT:     Head: Normocephalic and atraumatic.  Skin:    General: Skin is warm and dry.     Capillary Refill: Capillary refill takes less than 2 seconds.  Neurological:     General: No focal deficit present.     Mental Status: She is alert and oriented to person, place, and time.  Psychiatric:        Mood and Affect: Mood normal.        Behavior: Behavior normal.        Thought Content: Thought content normal.        Judgment: Judgment normal.      UC Treatments / Results  Labs (all labs ordered are listed, but only abnormal results are displayed) Labs Reviewed - No data to display  EKG   Radiology No results found.  Procedures Procedures (including critical care time)  Medications Ordered in UC Medications  dexamethasone (DECADRON) injection 10 mg (has no administration in time range)    Initial Impression / Assessment and Plan / UC Course  I have reviewed the triage vital signs and the nursing notes.  Pertinent labs & imaging results that were available during my care of the patient were reviewed by me and considered in my medical decision making (see chart for details).   Patient is a nontoxic-appearing  42 year old female presenting for evaluation of 4 days worth of frontal migraine headache as outlined in HPI above.  She is also complaining of sinus pressure that is been going on for last 2 weeks.  She is currently under the treatment of [facility] and is taking a 30-day course of azithromycin.  I have advised her that she will need to follow-up with ENT regarding her medication as she feels it is not working.  I will not be changing antibiotics because I do not know what their treatment plan entails or of altering antibiotic therapy will alter their treatment plan.  She reports that in the past she has had to come in for an injection of pain medicine for her migraines.  I can only find 1 previous visit to our urgent care from 2020 at which time she received Imitrex.  She is currently taking Imitrex and is taking Imitrex today without improvement of her symptoms.  Patient is also taking Topamax, Lyrica, and gabapentin.  I will have staff administer an IM injection of Decadron here in clinic and discharge patient home.  I will also have staff administer a milligrams of ODT Zofran.   Final Clinical Impressions(s) / UC Diagnoses   Final diagnoses:  Intractable migraine without aura and with status migrainosus     Discharge Instructions      Go home and rest in a cool dark environment.  Use over-the-counter ibuprofen or Aleve as needed for mild to moderate pain.  You may use your Imitrex as prescribed again tomorrow.  If your headache does not resolve, or worsens, please go to the ER for evaluation.  ED Prescriptions   None    PDMP not reviewed this encounter.   Becky Augusta, NP 03/17/23 1651    Becky Augusta, NP 03/17/23 [phone]

## 2023-05-15 ENCOUNTER — Other Ambulatory Visit: Payer: Self-pay | Admitting: Obstetrics and Gynecology

## 2023-05-15 DIAGNOSIS — R599 Enlarged lymph nodes, unspecified: Secondary | ICD-10-CM

## 2023-05-18 ENCOUNTER — Other Ambulatory Visit: Payer: Self-pay | Admitting: *Deleted

## 2023-05-18 ENCOUNTER — Inpatient Hospital Stay
Admission: RE | Admit: 2023-05-18 | Discharge: 2023-05-18 | Disposition: A | Discharge status: Home / Self Care | Payer: Self-pay | Source: Ambulatory Visit | Attending: Family Medicine | Admitting: Family Medicine

## 2023-05-18 DIAGNOSIS — Z1231 Encounter for screening mammogram for malignant neoplasm of breast: Secondary | ICD-10-CM

## 2023-05-28 ENCOUNTER — Ambulatory Visit
Admission: RE | Admit: 2023-05-28 | Discharge: 2023-05-28 | Disposition: A | Discharge status: Home / Self Care | Payer: Commercial Managed Care - PPO | Source: Ambulatory Visit | Attending: Obstetrics and Gynecology | Admitting: Obstetrics and Gynecology

## 2023-05-28 DIAGNOSIS — R599 Enlarged lymph nodes, unspecified: Secondary | ICD-10-CM | POA: Diagnosis present

## 2023-06-01 NOTE — Group Note (Deleted)

## 2023-06-22 ENCOUNTER — Other Ambulatory Visit: Payer: Self-pay

## 2023-06-22 ENCOUNTER — Emergency Department
Admission: EM | Admit: 2023-06-22 | Discharge: 2023-06-22 | Disposition: A | Discharge status: Home / Self Care | Payer: Commercial Managed Care - PPO | Attending: Emergency Medicine | Admitting: Emergency Medicine

## 2023-06-22 ENCOUNTER — Emergency Department: Payer: Commercial Managed Care - PPO

## 2023-06-22 DIAGNOSIS — Z1152 Encounter for screening for COVID-19: Secondary | ICD-10-CM | POA: Diagnosis not present

## 2023-06-22 DIAGNOSIS — I2510 Atherosclerotic heart disease of native coronary artery without angina pectoris: Secondary | ICD-10-CM | POA: Insufficient documentation

## 2023-06-22 DIAGNOSIS — E039 Hypothyroidism, unspecified: Secondary | ICD-10-CM | POA: Diagnosis not present

## 2023-06-22 DIAGNOSIS — E119 Type 2 diabetes mellitus without complications: Secondary | ICD-10-CM | POA: Insufficient documentation

## 2023-06-22 DIAGNOSIS — Z8541 Personal history of malignant neoplasm of cervix uteri: Secondary | ICD-10-CM | POA: Diagnosis not present

## 2023-06-22 DIAGNOSIS — R079 Chest pain, unspecified: Secondary | ICD-10-CM | POA: Diagnosis present

## 2023-06-22 DIAGNOSIS — J45909 Unspecified asthma, uncomplicated: Secondary | ICD-10-CM | POA: Insufficient documentation

## 2023-06-22 LAB — CBC
HCT: 38.3 % (ref 36.0–46.0)
Hemoglobin: 12.8 g/dL (ref 12.0–15.0)
MCH: 31.1 pg (ref 26.0–34.0)
MCHC: 33.4 g/dL (ref 30.0–36.0)
MCV: 93.2 fL (ref 80.0–100.0)
Platelets: 192 10*3/uL (ref 150–400)
RBC: 4.11 MIL/uL (ref 3.87–5.11)
RDW: 12.3 % (ref 11.5–15.5)
WBC: 10 10*3/uL (ref 4.0–10.5)
nRBC: 0 % (ref 0.0–0.2)

## 2023-06-22 LAB — BASIC METABOLIC PANEL
Anion gap: 7 (ref 5–15)
BUN: 8 mg/dL (ref 6–20)
CO2: 23 mmol/L (ref 22–32)
Calcium: 8.3 mg/dL — ABNORMAL LOW (ref 8.9–10.3)
Chloride: 107 mmol/L (ref 98–111)
Creatinine, Ser: 0.58 mg/dL (ref 0.44–1.00)
GFR, Estimated: >60 mL/min (ref >60)
Glucose, Bld: 94 mg/dL (ref 70–99)
Potassium: 3 mmol/L — ABNORMAL LOW (ref 3.5–5.1)
Sodium: 137 mmol/L (ref 135–145)

## 2023-06-22 LAB — RESP PANEL BY RT-PCR (RSV, FLU A&B, COVID)  RVPGX2
Influenza A by PCR: NEGATIVE
Influenza B by PCR: NEGATIVE
Resp Syncytial Virus by PCR: NEGATIVE
SARS Coronavirus 2 by RT PCR: NEGATIVE

## 2023-06-22 LAB — TROPONIN I (HIGH SENSITIVITY)
Troponin I (High Sensitivity): <2 ng/L (ref <18)
Troponin I (High Sensitivity): <2 ng/L (ref <18)

## 2023-06-22 LAB — D-DIMER, QUANTITATIVE: D-Dimer, Quant: 0.32 ug{FEU}/mL (ref 0.00–0.50)

## 2023-06-22 MED ORDER — POTASSIUM CHLORIDE CRYS ER 20 MEQ PO TBCR
40.0000 meq | EXTENDED_RELEASE_TABLET | Freq: Once | ORAL | Status: AC
  Administered 2023-06-22: 40 meq via ORAL
  Filled 2023-06-22: qty 2

## 2023-06-22 MED ORDER — IPRATROPIUM-ALBUTEROL 0.5-2.5 (3) MG/3ML IN SOLN
3.0000 mL | Freq: Once | RESPIRATORY_TRACT | Status: AC
  Administered 2023-06-22: 3 mL via RESPIRATORY_TRACT
  Filled 2023-06-22: qty 3

## 2023-06-22 MED ORDER — PREDNISONE 20 MG PO TABS: 60.0000 mg | ORAL_TABLET | Freq: Every day | ORAL | 0 refills | Status: DC

## 2023-06-22 MED ORDER — ALBUTEROL SULFATE HFA 108 (90 BASE) MCG/ACT IN AERS: 2.0000 | INHALATION_SPRAY | RESPIRATORY_TRACT | 0 refills | Status: DC | PRN

## 2023-06-22 MED ORDER — IBUPROFEN 800 MG PO TABS
800.0000 mg | ORAL_TABLET | Freq: Once | ORAL | Status: AC
  Administered 2023-06-22: 800 mg via ORAL
  Filled 2023-06-22: qty 1

## 2023-06-22 MED ORDER — PREDNISONE 20 MG PO TABS
60.0000 mg | ORAL_TABLET | Freq: Once | ORAL | Status: AC
  Administered 2023-06-22: 60 mg via ORAL
  Filled 2023-06-22: qty 3

## 2023-06-22 NOTE — Discharge Instructions (Addendum)
You may alternate Tylenol 1000 mg every 6 hours as needed for pain, fever and Ibuprofen 800 mg every 6-8 hours as needed for pain, fever.  Please take Ibuprofen with food.  Do not take more than 4000 mg of Tylenol (acetaminophen) in a 24 hour period. ° °

## 2023-06-22 NOTE — ED Provider Notes (Signed)
-----------------------------------------   7:03 AM on 06/22/2023 -----------------------------------------  Blood pressure 100/62, pulse 93, temperature 97.6 F (36.4 C), temperature source Oral, resp. rate 17, SpO2 100%.  Assuming care from Dr. Elesa Massed.  In short, Sandra Sanders is a 42 y.o. female with a chief complaint of Shortness of Breath .  Refer to the original H&P for additional details.  The current plan of care is to follow-up repeat troponin, SOB improved following breathing treatments.  ----------------------------------------- 9:04 AM on 06/22/2023 ----------------------------------------- Repeat troponin within normal limits, COVID and flu testing is negative.  On reassessment, patient reports her breathing feels better and she continues to maintain oxygen saturations on room air.  She is appropriate for outpatient management, has been prescribed steroids and rescue inhaler per Dr. Elesa Massed.  She was counseled to return to the ED for new or worsening symptoms, patient agrees with plan.    Chesley Noon, MD 06/22/23 [phone]

## 2023-06-22 NOTE — ED Provider Notes (Signed)
[facility] Provider Note    Event Date/Time   First MD Initiated Contact with Patient 06/22/23 [phone]     (approximate)   History   Shortness of Breath   HPI  IDY RAWLING is a 42 y.o. female with history of asthma, hypothyroidism, retinal vein occlusion of the left eye on aspirin who presents to the emergency department with chest tightness and shortness of breath.  No productive cough, fever.  No lower extremity swelling or pain.  No history of PE or DVT.  Has been wheezing at home.  Reports breathing treatments will help with her shortness of breath but then it will quickly return.  No history of high blood pressure, diabetes, hyperlipidemia per her report.  Quit smoking in 2010.  Does report significant family history of coronary artery disease.    History provided by patient.    Past Medical History:  Diagnosis Date   Anemia    Anxiety    Arthritis    Asthma    Cancer (HCC)    Cervical Cancer   COVID-19 09/2019   Diabetes mellitus without complication (HCC)    GERD (gastroesophageal reflux disease)    Headache    PFO (patent foramen ovale)    as an infant   Thyroid disease     Past Surgical History:  Procedure Laterality Date   ABDOMINAL HYSTERECTOMY     AUGMENTATION MAMMAPLASTY Bilateral 2000   CHOLECYSTECTOMY     KNEE SURGERY Left    LAPAROSCOPIC GASTRIC SLEEVE RESECTION N/A 04/22/2016   Procedure: LAPAROSCOPIC GASTRIC SLEEVE RESECTION;  Surgeon: Geoffry Paradise, MD;  Location: [facility] ORS;  Service: General;  Laterality: N/A;   SEPTOPLASTY      MEDICATIONS:  Prior to Admission medications   Medication Sig Start Date End Date Taking? Authorizing Provider  acetaminophen (TYLENOL) 500 MG tablet Take 1,000 mg by mouth every 6 (six) hours as needed.    [provider]  albuterol (VENTOLIN HFA) 108 (90 Base) MCG/ACT inhaler Inhale 1-2 puffs into the lungs every 4 (four) hours as needed for wheezing or shortness of breath. 01/08/22    Shirlee Latch, PA-C  ALPRAZolam Prudy Feeler) 0.5 MG tablet Take 0.5-1 mg by mouth 3 (three) times daily as needed. 06/03/21   [provider]  amphetamine-dextroamphetamine (ADDERALL) 20 MG tablet Take 20 mg by mouth 4 (four) times daily. 06/14/21   [provider]  aspirin EC 81 MG tablet Take 81 mg by mouth daily. Swallow whole.    [provider]  clobetasol cream (TEMOVATE) 0.05 % 1(ONE) APPLICATION(S) TOPICAL 2(TWO) TIMES A DAY 10/25/21   [provider]  cyclobenzaprine (FLEXERIL) 5 MG tablet Take 1 tablet (5 mg total) by mouth 3 (three) times daily as needed for muscle spasms. 05/09/22   White, Elita Boone, NP  escitalopram (LEXAPRO) 20 MG tablet Take 20 mg by mouth daily. 04/02/22   [provider]  furosemide (LASIX) 20 MG tablet Take 1 tablet by mouth daily. 04/18/22   [provider]  gabapentin (NEURONTIN) 600 MG tablet Take 600 mg by mouth 2 (two) times daily. 03/09/22   [provider]  meloxicam (MOBIC) 7.5 MG tablet Take 1 tablet (7.5 mg total) by mouth daily. 05/09/22   White, Elita Boone, NP  pantoprazole (PROTONIX) 40 MG tablet Take 40 mg by mouth 2 (two) times daily. 06/21/21   [provider]  pregabalin (LYRICA) 75 MG capsule Take 75 mg by mouth 2 (two) times daily. 04/09/22  [provider]  SUMAtriptan (IMITREX) 50 MG tablet Take 1-2 tablets (50-100 mg total) by mouth once for 1 dose. May repeat in 2 hours if headache persists or recurs. 10/31/20   [provider]  thyroid (ARMOUR) 60 MG tablet Take 60 mg by mouth 2 (two) times daily.    [provider]  topiramate (TOPAMAX) 25 MG tablet Take 25 mg by mouth 2 (two) times daily.    [provider]    Physical Exam   Triage Vital Signs: ED Triage Vitals [06/22/23 0506]  Encounter Vitals Group     BP 110/77     Systolic BP Percentile      Diastolic BP Percentile      Pulse Rate (!) 111     Resp (!) 24     Temp 97.6 F (36.4 C)      Temp Source Oral     SpO2 98 %     Weight      Height      Head Circumference      Peak Flow      Pain Score      Pain Loc      Pain Education      Exclude from Growth Chart     Most recent vital signs: Vitals:   06/22/23 0506 06/22/23 0605  BP: 110/77 100/62  Pulse: (!) 111 93  Resp: (!) 24 17  Temp: 97.6 F (36.4 C)   SpO2: 98% 100%    CONSTITUTIONAL: Alert, responds appropriately to questions. Well-appearing; well-nourished HEAD: Normocephalic, atraumatic EYES: Conjunctivae clear, pupils appear equal, sclera nonicteric ENT: normal nose; moist mucous membranes NECK: Supple, normal ROM CARD: Regular and tachycardic; S1 and S2 appreciated RESP: Slightly tachypneic, breath sounds clear and equal bilaterally; no wheezes, no rhonchi, no rales, no hypoxia or respiratory distress, speaking full sentences ABD/GI: Non-distended; soft, non-tender, no rebound, no guarding, no peritoneal signs BACK: The back appears normal EXT: Normal ROM in all joints; no deformity noted, no edema, no calf tenderness or calf swelling SKIN: Normal color for age and race; warm; no rash on exposed skin NEURO: Moves all extremities equally, normal speech PSYCH: The patient's mood and manner are appropriate.   ED Results / Procedures / Treatments   LABS: (all labs ordered are listed, but only abnormal results are displayed) Labs Reviewed  BASIC METABOLIC PANEL - Abnormal; Notable for the following components:      Result Value   Potassium 3.0 (*)    Calcium 8.3 (*)    All other components within normal limits  RESP PANEL BY RT-PCR (RSV, FLU A&B, COVID)  RVPGX2  CBC  D-DIMER, QUANTITATIVE  TROPONIN I (HIGH SENSITIVITY)     EKG:  EKG Interpretation Date/Time:  Monday June 22 2023 05:12:18 EDT Ventricular Rate:  104 PR Interval:  112 QRS Duration:  84 QT Interval:  328 QTC Calculation: 431 R Axis:   76  Text Interpretation: Sinus tachycardia ST & T wave abnormality, consider  inferior ischemia ST & T wave abnormality, consider anterolateral ischemia Abnormal ECG When compared with ECG of 26-Oct-2019 10:36, Vent. rate has increased BY  34 BPM T wave inversion now evident in Inferior leads T wave inversion now evident in Anterolateral leads Confirmed by Cordarious Zeek, Baxter Hire [phone]) on 06/22/2023 5:23:34 AM        Date: 06/22/2023 5:48 AM  Rate: 93  Rhythm: normal sinus rhythm  QRS Axis: normal  Intervals: normal  ST/T Wave abnormalities: normal  Conduction Disutrbances: none  Narrative Interpretation: unremarkable     RADIOLOGY: My personal review and interpretation of imaging: Chest x-ray clear.  I have personally reviewed all radiology reports.   DG Chest 2 View  Result Date: 06/22/2023 CLINICAL DATA:  Shortness of breath and chest pain. EXAM: CHEST - 2 VIEW COMPARISON:  01/08/2022 FINDINGS: The cardiopericardial silhouette is within normal limits for size. The lungs are clear without focal pneumonia, edema, pneumothorax or pleural effusion. No acute bony abnormality. Radiopaque foreign body projects over the lower cervical spine, indeterminate and presumably external to the patient. Telemetry leads overlie the chest. IMPRESSION: 1. No active cardiopulmonary disease. 2. Radiopaque foreign body projects over the lower cervical spine, presumably external to the patient. This should be amenable to clinical evaluation. Electronically Signed   By: Kennith Center M.D.   On: 06/22/2023 06:21     PROCEDURES:  Critical Care performed: No   CRITICAL CARE Performed by: Baxter Hire Bardia Wangerin   Total critical care time: 0 minutes  Critical care time was exclusive of separately billable procedures and treating other patients.  Critical care was necessary to treat or prevent imminent or life-threatening deterioration.  Critical care was time spent personally by me on the following activities: development of treatment plan with patient and/or surrogate as well as nursing,  discussions with consultants, evaluation of patient's response to treatment, examination of patient, obtaining history from patient or surrogate, ordering and performing treatments and interventions, ordering and review of laboratory studies, ordering and review of radiographic studies, pulse oximetry and re-evaluation of patient's condition.   Marland Kitchen1-3 Lead EKG Interpretation  Performed by: Congetta Odriscoll, Layla Maw, DO Authorized by: Maymuna Detzel, Layla Maw, DO     Interpretation: abnormal     ECG rate:  111   ECG rate assessment: tachycardic     Rhythm: sinus tachycardia     Ectopy: none     Conduction: normal       IMPRESSION / MDM / ASSESSMENT AND PLAN / ED COURSE  I reviewed the triage vital signs and the nursing notes.    Patient here with chest tightness, shortness of breath.  She is tachycardic with EKG changes.  The patient is on the cardiac monitor to evaluate for evidence of arrhythmia and/or significant heart rate changes.   DIFFERENTIAL DIAGNOSIS (includes but not limited to):   ACS, PE, pneumonia, CHF, asthma exacerbation   Patient's presentation is most consistent with acute presentation with potential threat to life or bodily function.   PLAN: Will obtain cardiac labs, D-dimer, chest x-ray.  She is not wheezing and has good aeration but feels like albuterol at home has helped her.  Will give a DuoNeb to see if this helps improve her symptoms.  I am concerned because her EKG shows diffuse ST depression compared to previous EKGs.  No ST elevation.   MEDICATIONS GIVEN IN ED: Medications  ibuprofen (ADVIL) tablet 800 mg (has no administration in time range)  ipratropium-albuterol (DUONEB) 0.5-2.5 (3) MG/3ML nebulizer solution 3 mL (has no administration in time range)  ipratropium-albuterol (DUONEB) 0.5-2.5 (3) MG/3ML nebulizer solution 3 mL (3 mLs Nebulization Given 06/22/23 0549)  potassium chloride SA (KLOR-CON M) CR tablet 40 mEq (40 mEq Oral Given 06/22/23 0604)  predniSONE  (DELTASONE) tablet 60 mg (60 mg Oral Given 06/22/23 0604)     ED COURSE: Patient's repeat EKG shows no ST depression.  This could be secondary to lead placement.  No significant risk factors for ACS other than previous tobacco use 14 years ago and family history  of a grandfather with a heart attack at 79.  Her first troponin is negative.  Her D-dimer is negative.  Second troponin pending.  Potassium slightly low at 3.0.  Getting oral replacement.  Chest x-ray reviewed and interpreted by myself and the radiologist and is clear.  She has metallic object seen in her cervical spine and reports she has had previous neck surgery with hardware.  Patient reports she is feeling achy all over and cold with chills.  Will obtain COVID and flu swab.  Will give ibuprofen.  Reports some improvement with breathing treatment.  Will give a second breathing treatment here to see if we can continue to get her to feel better.  Signed out the oncoming ED provider at 7 AM to follow-up on patient's second troponin, COVID and flu swabs.  CONSULTS:  none   OUTSIDE RECORDS REVIEWED: Reviewed last [facility] rheumatology note on 05/19/2023.       FINAL CLINICAL IMPRESSION(S) / ED DIAGNOSES   Final diagnoses:  Chest pain, unspecified type     Rx / DC Orders   ED Discharge Orders          Ordered    albuterol (VENTOLIN HFA) 108 (90 Base) MCG/ACT inhaler  Every 4 hours PRN        06/22/23 0631    predniSONE (DELTASONE) 20 MG tablet  Daily        06/22/23 0631             Note:  This document was prepared using Dragon voice recognition software and may include unintentional dictation errors.   Lori Liew, Layla Maw, DO 06/22/23 [phone]

## 2023-06-22 NOTE — ED Notes (Addendum)
Patient given blanket and call bell

## 2023-06-22 NOTE — ED Triage Notes (Addendum)
Pt to ED via POV c/o shortness of breath. Reports it started today around midnight. Pt has hx of asthma, has taken her inhalers with no relief. Pt also endorses chest tightness. Pt states she was [place] a [place] of [place] from [place]th[place]21st. Pt was sick for past week, took covid test and was negative.

## 2023-06-22 NOTE — ED Notes (Signed)
Patient ambulatory to bathroom.

## 2023-06-22 NOTE — ED Notes (Signed)
Patient returned from xray.

## 2023-07-15 ENCOUNTER — Ambulatory Visit
Admission: EM | Admit: 2023-07-15 | Discharge: 2023-07-15 | Disposition: A | Discharge status: Home / Self Care | Payer: Commercial Managed Care - PPO | Attending: Emergency Medicine | Admitting: Emergency Medicine

## 2023-07-15 ENCOUNTER — Ambulatory Visit (INDEPENDENT_AMBULATORY_CARE_PROVIDER_SITE_OTHER): Payer: Commercial Managed Care - PPO

## 2023-07-15 DIAGNOSIS — R059 Cough, unspecified: Secondary | ICD-10-CM | POA: Diagnosis not present

## 2023-07-15 DIAGNOSIS — J180 Bronchopneumonia, unspecified organism: Secondary | ICD-10-CM | POA: Insufficient documentation

## 2023-07-15 DIAGNOSIS — D849 Immunodeficiency, unspecified: Secondary | ICD-10-CM | POA: Diagnosis not present

## 2023-07-15 DIAGNOSIS — J189 Pneumonia, unspecified organism: Secondary | ICD-10-CM | POA: Insufficient documentation

## 2023-07-15 DIAGNOSIS — Z1152 Encounter for screening for COVID-19: Secondary | ICD-10-CM | POA: Diagnosis not present

## 2023-07-15 LAB — RESP PANEL BY RT-PCR (RSV, FLU A&B, COVID)  RVPGX2
Influenza A by PCR: NEGATIVE
Influenza B by PCR: NEGATIVE
Resp Syncytial Virus by PCR: NEGATIVE
SARS Coronavirus 2 by RT PCR: NEGATIVE

## 2023-07-15 LAB — GROUP A STREP BY PCR: Group A Strep by PCR: NOT DETECTED

## 2023-07-15 MED ORDER — SUMATRIPTAN SUCCINATE 6 MG/0.5ML ~~LOC~~ SOLN
6.0000 mg | Freq: Once | SUBCUTANEOUS | Status: AC
  Administered 2023-07-15: 6 mg via SUBCUTANEOUS

## 2023-07-15 MED ORDER — PROMETHAZINE-DM 6.25-15 MG/5ML PO SYRP: 5.0000 mL | ORAL_SOLUTION | Freq: Four times a day (QID) | ORAL | 0 refills | Status: DC | PRN

## 2023-07-15 MED ORDER — LEVOFLOXACIN 500 MG PO TABS: 500.0000 mg | ORAL_TABLET | Freq: Every day | ORAL | 0 refills | Status: DC

## 2023-07-15 MED ORDER — BENZONATATE 100 MG PO CAPS: 200.0000 mg | ORAL_CAPSULE | Freq: Three times a day (TID) | ORAL | 0 refills | Status: DC

## 2023-07-15 MED ORDER — SODIUM CHLORIDE 0.9 % IV BOLUS
1000.0000 mL | Freq: Once | INTRAVENOUS | Status: AC
  Administered 2023-07-15: 1000 mL via INTRAVENOUS

## 2023-07-15 MED ORDER — ALBUTEROL SULFATE HFA 108 (90 BASE) MCG/ACT IN AERS: 2.0000 | INHALATION_SPRAY | RESPIRATORY_TRACT | 0 refills | Status: AC | PRN

## 2023-07-15 MED ORDER — AEROCHAMBER MV MISC: 2 refills | Status: AC

## 2023-07-15 NOTE — Discharge Instructions (Addendum)
Take the Levaquin 500 milligrams once daily for 7 days for treatment of pneumonia.  Use the albuterol inhaler with a spacer, 2 puffs every 4-6 hours, as needed for shortness of breath or wheezing.  Use the Tessalon Perles every 8 hours during the day as needed for cough.  Taken with a small sip of water.  These may give you numbness to the base of your tongue or metallic taste in mouth, this is normal.  Use the Promethazine DM cough syrup at bedtime for cough and congestion as it would make you drowsy.  Return for reevaluation if you have any new or worsening symptoms.  Follow-up with your primary care provider in 4 to 6 weeks for a repeat chest x-ray to ensure resolution of your pneumonia.

## 2023-07-15 NOTE — ED Provider Notes (Signed)
[facility]    CSN: [id number] Arrival date & time: 07/15/23  0849      History   Chief Complaint Chief Complaint  Patient presents with   Generalized Body Aches    HPI Sandra Sanders is a 42 y.o. female.   HPI  42 year old female with a past medical history significant for asthma, cervical cancer, diabetes, GERD, PFO, thyroid disease, and history of gastric bypass presents for evaluation of 2 days worth of symptoms which include fever with a Tmax of 100.9, runny nose nasal congestion, sore throat, nonproductive cough with shortness breath and wheezing.  She has been using her inhaler with some improvement.  She also endorses lightheadedness and decreased appetite.  No vomiting or diarrhea.  No ear pain.  Past Medical History:  Diagnosis Date   Anemia    Anxiety    Arthritis    Asthma    Cancer (HCC)    Cervical Cancer   COVID-19 09/2019   Diabetes mellitus without complication (HCC)    GERD (gastroesophageal reflux disease)    Headache    PFO (patent foramen ovale)    as an infant   Thyroid disease     Patient Active Problem List   Diagnosis Date Noted   Chalazion of left lower eyelid 02/10/2023   Viral illness 02/10/2023   Otalgia of both ears 02/10/2023   Hx MRSA infection 01/08/2022   Pain in joint of right ankle 07/09/2021   Contusion of right ankle 07/09/2021   Muscle strain of lower extremity 06/27/2021   Migraine 06/27/2021   Non-seasonal allergic rhinitis 01/22/2021   Epigastric pain 12/27/2020   B12 deficiency 09/23/2018   Degenerative disc disease at L5-S1 level 12/30/2017   Gastroesophageal reflux disease 05/26/2017   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   Hematochezia 11/19/2016   Morbid obesity (HCC) 04/22/2016   Iron deficiency 03/18/2016   Helicobacter pylori gastrointestinal tract infection 01/18/2016   Vitamin D deficiency 01/18/2016   Fatigue 12/18/2015   Malaise 12/18/2015    Snoring 12/18/2015   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   Thyroid disease 02/23/2014   Syncope 12/09/2013   Insomnia 09/19/2013   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      Home Medications    Prior to Admission medications   Medication Sig Start Date End Date Taking? Authorizing Provider  acetaminophen (TYLENOL) 500 MG tablet Take 1,000 mg by mouth every 6 (six) hours as needed.   Yes [provider]  albuterol (VENTOLIN HFA) 108 (90 Base) MCG/ACT inhaler Inhale 2 puffs into the lungs every 4 (four) hours as needed. 07/15/23  Yes Becky Augusta, NP  ALPRAZolam Prudy Feeler) 0.5 MG tablet Take 0.5-1 mg by mouth 3 (three) times daily as needed. 06/03/21  Yes [provider]  amphetamine-dextroamphetamine (  ADDERALL) 20 MG tablet Take 20 mg by mouth 4 (four) times daily. 06/14/21  Yes [provider]  aspirin EC 81 MG tablet Take 81 mg by mouth daily. Swallow whole.   Yes [provider]  benzonatate (TESSALON) 100 MG capsule Take 2 capsules (200 mg total) by mouth every 8 (eight) hours. 07/15/23  Yes Becky Augusta, NP  clobetasol cream (TEMOVATE) 0.05 % 1(ONE) APPLICATION(S) TOPICAL 2(TWO) TIMES A DAY 10/25/21  Yes [provider]  cyclobenzaprine (FLEXERIL) 5 MG tablet Take 1 tablet (5 mg total) by mouth 3 (three) times daily  as needed for muscle spasms. 05/09/22  Yes White, Adrienne R, NP  escitalopram (LEXAPRO) 20 MG tablet Take 20 mg by mouth daily. 04/02/22  Yes [provider]  furosemide (LASIX) 20 MG tablet Take 1 tablet by mouth daily. 04/18/22  Yes [provider]  gabapentin (NEURONTIN) 600 MG tablet Take 600 mg by mouth 2 (two) times daily. 03/09/22  Yes [provider]  levofloxacin (LEVAQUIN) 500 MG tablet Take 1 tablet (500 mg total) by mouth daily. 07/15/23  Yes Becky Augusta, NP  meloxicam (MOBIC) 7.5 MG tablet Take 1 tablet (7.5 mg total) by mouth daily. 05/09/22  Yes White, Adrienne R, NP  pantoprazole (PROTONIX) 40 MG tablet Take 40 mg by mouth 2 (two) times daily. 06/21/21  Yes [provider]  pregabalin (LYRICA) 75 MG capsule Take 75 mg by mouth 2 (two) times daily. 04/09/22  Yes [provider]  promethazine-dextromethorphan (PROMETHAZINE-DM) 6.25-15 MG/5ML syrup Take 5 mLs by mouth 4 (four) times daily as needed. 07/15/23  Yes Becky Augusta, NP  Spacer/Aero-Holding Chambers (AEROCHAMBER MV) inhaler Use as instructed 07/15/23  Yes Becky Augusta, NP  SUMAtriptan (IMITREX) 50 MG tablet Take 1-2 tablets (50-100 mg total) by mouth once for 1 dose. May repeat in 2 hours if headache persists or recurs. 10/31/20  Yes [provider]  thyroid (ARMOUR) 60 MG tablet Take 60 mg by mouth 2 (two) times daily.   Yes [provider]  topiramate (TOPAMAX) 25 MG tablet Take 25 mg by mouth 2 (two) times daily.   Yes [provider]  predniSONE (DELTASONE) 20 MG tablet Take 3 tablets (60 mg total) by mouth daily. Start on 06/23/23 06/22/23   Ward, Layla Maw, DO    Family History Family History  Problem Relation Age of Onset   Hypertension Mother    Hypertension Father    Cancer Other     Social History Social History   Tobacco Use   Smoking status: Former    Current packs/day: 0.00    Types: Cigarettes    Quit date: 03/22/2004    Years since  quitting: 19.3   Smokeless tobacco: Never  Vaping Use   Vaping status: Never Used  Substance Use Topics   Alcohol use: No   Drug use: No     Allergies   Hydromorphone, Hydromorphone hcl, Other, Hydrocodone, Tramadol, Hydrocodone-acetaminophen, Banana, Citrullus vulgaris, Daucus carota, Lidocaine, Miconazole, and Tioconazole   Review of Systems Review of Systems  Constitutional:  Positive for fever.  HENT:  Positive for congestion, rhinorrhea and sore throat. Negative for ear pain.   Respiratory:  Positive for cough, shortness of breath and wheezing.   Gastrointestinal:  Positive for nausea. Negative for abdominal pain, diarrhea and vomiting.  Neurological:  Positive for light-headedness. Negative for dizziness and syncope.     Physical Exam Triage Vital Signs ED Triage Vitals  Encounter Vitals Group     BP  Systolic BP Percentile      Diastolic BP Percentile      Pulse      Resp      Temp      Temp src      SpO2      Weight      Height      Head Circumference      Peak Flow      Pain Score      Pain Loc      Pain Education      Exclude from Growth Chart    No data found.  Updated Vital Signs BP (!) 88/60 (BP Location: Left Arm)   Pulse 70   Temp 97.7 F (36.5 C) (Oral)   Resp (!) 22   SpO2 99%   Visual Acuity Right Eye Distance:   Left Eye Distance:   Bilateral Distance:    Right Eye Near:   Left Eye Near:    Bilateral Near:     Physical Exam Vitals and nursing note reviewed.  Constitutional:      Appearance: Normal appearance. She is ill-appearing.  HENT:     Head: Normocephalic and atraumatic.     Right Ear: Tympanic membrane, ear canal and external ear normal. There is no impacted cerumen.     Left Ear: Tympanic membrane, ear canal and external ear normal. There is no impacted cerumen.     Nose: Congestion present. No rhinorrhea.     Comments: Nasal mucosa is mildly edematous and erythematous with scant clear discharge in both nares.     Mouth/Throat:     Mouth: Mucous membranes are moist.     Pharynx: Oropharynx is clear. Posterior oropharyngeal erythema present. No oropharyngeal exudate.     Comments: Tonsillar pillars are unremarkable.  Soft palate does demonstrate erythema with mild injection, as does the posterior oropharynx.  No appreciable exudate. Cardiovascular:     Rate and Rhythm: Normal rate and regular rhythm.     Pulses: Normal pulses.     Heart sounds: Normal heart sounds. No murmur heard.    No friction rub. No gallop.  Pulmonary:     Effort: Pulmonary effort is normal.     Breath sounds: Normal breath sounds. No wheezing, rhonchi or rales.  Musculoskeletal:     Cervical back: Normal range of motion and neck supple. No tenderness.  Lymphadenopathy:     Cervical: No cervical adenopathy.  Skin:    General: Skin is warm and dry.     Capillary Refill: Capillary refill takes less than 2 seconds.     Findings: No rash.  Neurological:     General: No focal deficit present.     Mental Status: She is alert and oriented to person, place, and time.      UC Treatments / Results  Labs (all labs ordered are listed, but only abnormal results are displayed) Labs Reviewed  RESP PANEL BY RT-PCR (RSV, FLU A&B, COVID)  RVPGX2  GROUP A STREP BY PCR    EKG   Radiology No results found.  Procedures Procedures (including critical care time)  Medications Ordered in UC Medications  sodium chloride 0.9 % bolus 1,000 mL (1,000 mLs Intravenous New Bag/Given 07/15/23 1021)  SUMAtriptan (IMITREX) injection 6 mg (6 mg Subcutaneous Given 07/15/23 1028)    Initial Impression / Assessment and Plan / UC Course  I have reviewed the triage vital signs and the nursing notes.  Pertinent labs & imaging results that were available during my  care of the patient were reviewed by me and considered in my medical decision making (see chart for details).   Patient is a pleasant, though mildly ill-appearing 42 year old female  presenting for evaluation of respiratory and GI symptoms as outlined HPI above.  Patient also has a low blood pressure initially measured at 80/47.  Had it rechecked manually and her blood pressure was 88/68.  She does have a history of low blood pressure and does take Lasix daily, though she has not taken her Lasix in the past 4 days.  She does endorse a decreased appetite with decreased fluid and food intake.  Given her low blood pressure there is concern for organ perfusion so I will order a fluid bolus.  Additionally, I will order a respiratory panel to look for COVID or influenza.  Strep PCR secondary to patient's sore throat, and chest x-ray to evaluate for the presence of pneumonia.  Patient complaining of migraine headache.  She is prescribed sumatriptan hand for relief of migraine headaches at home.  I will order 6 mg IM injection of sumatriptan here in clinic to address patient's migraine pain.  Respiratory panel is negative for COVID, influenza, and RSV.  Group A strep negative.  Chest x-ray independently reviewed and evaluated by me.  Impression: Patient has a right upper lobe pneumonia that is best visualized on the lateral projection.  Radiology overread is pending.  The patient reports that her headache has improved after the Imitrex and she is feeling better following IV fluid ministration.  I will let the remainder of the 1 L bolus infused and then discharge patient home with a diagnosis of community-acquired pneumonia on Levaquin 500 milligrams once daily for 7 days.  I will also prescribe albuterol inhaler and spacer that she can use every 4-6 hours as needed for any shortness breath or wheezing.  Tessalon Perles and Promethazine DM cough syrup for cough and congestion.  Return and ER precautions reviewed.  I will take the patient out of work the remainder of the week.  Final Clinical Impressions(s) / UC Diagnoses   Final diagnoses:  Community acquired pneumonia of right upper lobe  of lung     Discharge Instructions      Take the Levaquin 500 milligrams once daily for 7 days for treatment of pneumonia.  Use the albuterol inhaler with a spacer, 2 puffs every 4-6 hours, as needed for shortness of breath or wheezing.  Use the Tessalon Perles every 8 hours during the day as needed for cough.  Taken with a small sip of water.  These may give you numbness to the base of your tongue or metallic taste in mouth, this is normal.  Use the Promethazine DM cough syrup at bedtime for cough and congestion as it would make you drowsy.  Return for reevaluation if you have any new or worsening symptoms.  Follow-up with your primary care provider in 4 to 6 weeks for a repeat chest x-ray to ensure resolution of your pneumonia.      ED Prescriptions     Medication Sig Dispense Auth. Provider   levofloxacin (LEVAQUIN) 500 MG tablet Take 1 tablet (500 mg total) by mouth daily. 7 tablet Becky Augusta, NP   albuterol (VENTOLIN HFA) 108 (90 Base) MCG/ACT inhaler Inhale 2 puffs into the lungs every 4 (four) hours as needed. 18 g Becky Augusta, NP   Spacer/Aero-Holding Chambers (AEROCHAMBER MV) inhaler Use as instructed 1 each Becky Augusta, NP   benzonatate (TESSALON) 100 MG capsule  Take 2 capsules (200 mg total) by mouth every 8 (eight) hours. 21 capsule Becky Augusta, NP   promethazine-dextromethorphan (PROMETHAZINE-DM) 6.25-15 MG/5ML syrup Take 5 mLs by mouth 4 (four) times daily as needed. 118 mL Becky Augusta, NP      PDMP not reviewed this encounter.   Becky Augusta, NP 07/15/23 1101

## 2023-07-15 NOTE — ED Triage Notes (Addendum)
Nausea-bodyaches-cough(right side lung hurts when coughing) immunosuppressed    Sx x 2 days. Last dose of tylenol was 3 am

## 2023-07-21 ENCOUNTER — Ambulatory Visit: Admission: EM | Admit: 2023-07-21 | Discharge: 2023-07-21 | Payer: Commercial Managed Care - PPO

## 2023-07-21 ENCOUNTER — Encounter: Payer: Self-pay | Admitting: Emergency Medicine

## 2023-07-21 DIAGNOSIS — J189 Pneumonia, unspecified organism: Secondary | ICD-10-CM

## 2023-07-21 NOTE — ED Notes (Addendum)
Patient is being discharged from the Urgent Care and sent to the Emergency Department via POV . Per Becky Augusta NP, patient is in need of higher level of care due to worsening SOB & failure to improve w/7 day course of Levaquin due to pneumonia  . Patient is aware and verbalizes understanding of plan of care.  Vitals:   07/21/23 1834  BP: 102/64  Pulse: 79  Resp: 18  Temp: (!) 97.5 F (36.4 C)  SpO2: 100%

## 2023-07-21 NOTE — Discharge Instructions (Addendum)
As we discussed, you completed a course of antibiotics and you are continuing to run fevers, have chest pain, have shortness of breath, and have a productive cough.  This is considered a failure of outpatient treatment for pneumonia.  Please go to [facility] for further evaluation and IV antibiotics.

## 2023-07-21 NOTE — ED Triage Notes (Signed)
Pt was seen on 07/15/23 and is not better. She continues to have a cough, SOB, and chest pain.

## 2023-07-21 NOTE — ED Provider Notes (Signed)
[facility]    CSN: [id number] Arrival date & time: 07/21/23  1808      History   Chief Complaint Chief Complaint  Patient presents with   Chest Pain   Shortness of Breath   Cough    HPI Sandra Sanders is a 42 y.o. female.   HPI  42 year old female with a past medical history significant for asthma presents for continued fever with a Tmax of 101, fatigue, cough, chest pain, and shortness of breath.  She just finished a 7-day course of Levaquin for pneumonia and has not had any improvement of her symptoms.  She repeats that she has been using her inhaler frequently and is also exhausted her cough medication without any improvement of symptoms.  Past Medical History:  Diagnosis Date   Anemia    Anxiety    Arthritis    Asthma    Cancer (HCC)    Cervical Cancer   COVID-19 09/2019   Diabetes mellitus without complication (HCC)    GERD (gastroesophageal reflux disease)    Headache    PFO (patent foramen ovale)    as an infant   Thyroid disease     Patient Active Problem List   Diagnosis Date Noted   Chalazion of left lower eyelid 02/10/2023   Viral illness 02/10/2023   Otalgia of both ears 02/10/2023   Hx MRSA infection 01/08/2022   Pain in joint of right ankle 07/09/2021   Contusion of right ankle 07/09/2021   Muscle strain of lower extremity 06/27/2021   Migraine 06/27/2021   Non-seasonal allergic rhinitis 01/22/2021   Epigastric pain 12/27/2020   B12 deficiency 09/23/2018   Degenerative disc disease at L5-S1 level 12/30/2017   Gastroesophageal reflux disease 05/26/2017   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   Hematochezia 11/19/2016   Morbid obesity (HCC) 04/22/2016   Iron deficiency 03/18/2016   Helicobacter pylori gastrointestinal tract infection 01/18/2016   Vitamin D deficiency 01/18/2016   Fatigue 12/18/2015   Malaise 12/18/2015   Snoring 12/18/2015   Varicose veins of bilateral lower  extremities with other complications 05/30/2014   Thyroid disease 02/23/2014   Syncope 12/09/2013   Insomnia 09/19/2013   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      Home Medications    Prior to Admission medications   Medication Sig Start Date End Date Taking? Authorizing Provider  acetaminophen (TYLENOL) 500 MG tablet Take 1,000 mg by mouth every 6 (six) hours as needed.    [provider]  albuterol (VENTOLIN HFA) 108 (90 Base) MCG/ACT inhaler Inhale 2 puffs into the lungs every 4 (four) hours as needed. 07/15/23   Becky Augusta, NP  ALPRAZolam Prudy Feeler) 0.5 MG tablet Take 0.5-1 mg by mouth 3 (three) times daily as needed. 06/03/21   [provider]  amphetamine-dextroamphetamine (ADDERALL) 20  MG tablet Take 20 mg by mouth 4 (four) times daily. 06/14/21   [provider]  aspirin EC 81 MG tablet Take 81 mg by mouth daily. Swallow whole.    [provider]  benzonatate (TESSALON) 100 MG capsule Take 2 capsules (200 mg total) by mouth every 8 (eight) hours. 07/15/23   Becky Augusta, NP  clobetasol cream (TEMOVATE) 0.05 % 1(ONE) APPLICATION(S) TOPICAL 2(TWO) TIMES A DAY 10/25/21   [provider]  cyclobenzaprine (FLEXERIL) 5 MG tablet Take 1 tablet (5 mg total) by mouth 3 (three) times daily as needed for muscle spasms. 05/09/22   White, Elita Boone, NP  escitalopram  (LEXAPRO) 20 MG tablet Take 20 mg by mouth daily. 04/02/22   [provider]  furosemide (LASIX) 20 MG tablet Take 1 tablet by mouth daily. 04/18/22   [provider]  gabapentin (NEURONTIN) 600 MG tablet Take 600 mg by mouth 2 (two) times daily. 03/09/22   [provider]  levofloxacin (LEVAQUIN) 500 MG tablet Take 1 tablet (500 mg total) by mouth daily. 07/15/23   Becky Augusta, NP  meloxicam (MOBIC) 7.5 MG tablet Take 1 tablet (7.5 mg total) by mouth daily. 05/09/22   White, Elita Boone, NP  pantoprazole (PROTONIX) 40 MG tablet Take 40 mg by mouth 2 (two) times daily. 06/21/21   [provider]  predniSONE (DELTASONE) 20 MG tablet Take 3 tablets (60 mg total) by mouth daily. Start on 06/23/23 06/22/23   Ward, Layla Maw, DO  pregabalin (LYRICA) 75 MG capsule Take 75 mg by mouth 2 (two) times daily. 04/09/22   [provider]  promethazine-dextromethorphan (PROMETHAZINE-DM) 6.25-15 MG/5ML syrup Take 5 mLs by mouth 4 (four) times daily as needed. 07/15/23   Becky Augusta, NP  Spacer/Aero-Holding Deretha Emory (AEROCHAMBER MV) inhaler Use as instructed 07/15/23   Becky Augusta, NP  SUMAtriptan (IMITREX) 50 MG tablet Take 1-2 tablets (50-100 mg total) by mouth once for 1 dose. May repeat in 2 hours if headache persists or recurs. 10/31/20   [provider]  thyroid (ARMOUR) 60 MG tablet Take 60 mg by mouth 2 (two) times daily.    [provider]  topiramate (TOPAMAX) 25 MG tablet Take 25 mg by mouth 2 (two) times daily.    [provider]    Family History Family History  Problem Relation Age of Onset   Hypertension Mother    Hypertension Father    Cancer Other     Social History Social History   Tobacco Use   Smoking status: Former    Current packs/day: 0.00    Types: Cigarettes    Quit date: 03/22/2004    Years since quitting: 19.3   Smokeless tobacco: Never  Vaping Use   Vaping status: Never Used  Substance Use Topics   Alcohol  use: No   Drug use: No     Allergies   Hydromorphone, Hydromorphone hcl, Other, Hydrocodone, Tramadol, Hydrocodone-acetaminophen, Banana, Citrullus vulgaris, Daucus carota, Lidocaine, Miconazole, and Tioconazole   Review of Systems Review of Systems  Constitutional:  Positive for fever.  Respiratory:  Positive for cough and shortness of breath.   Cardiovascular:  Positive for chest pain.     Physical Exam Triage Vital Signs ED Triage Vitals  Encounter Vitals Group     BP 07/21/23 1834 102/64     Systolic BP Percentile --      Diastolic BP Percentile --      Pulse Rate 07/21/23 1834 79     Resp  07/21/23 1834 18     Temp 07/21/23 1834 (!) 97.5 F (36.4 C)     Temp Source 07/21/23 1834 Oral     SpO2 07/21/23 1834 100 %     Weight --      Height --      Head Circumference --      Peak Flow --      Pain Score 07/21/23 1833 8     Pain Loc --      Pain Education --      Exclude from Growth Chart --    No data found.  Updated Vital Signs BP 102/64 (BP Location: Left Arm)   Pulse 79   Temp (!) 97.5 F (36.4 C) (Oral)   Resp 18   SpO2 100%   Visual Acuity Right Eye Distance:   Left Eye Distance:   Bilateral Distance:    Right Eye Near:   Left Eye Near:    Bilateral Near:     Physical Exam Vitals and nursing note reviewed.  Constitutional:      Appearance: Normal appearance. She is ill-appearing.  HENT:     Head: Normocephalic and atraumatic.  Cardiovascular:     Rate and Rhythm: Normal rate and regular rhythm.     Pulses: Normal pulses.     Heart sounds: Normal heart sounds. No murmur heard.    No friction rub. No gallop.  Pulmonary:     Effort: Pulmonary effort is normal.     Breath sounds: Normal breath sounds. No wheezing, rhonchi or rales.  Skin:    General: Skin is warm and dry.     Capillary Refill: Capillary refill takes less than 2 seconds.  Neurological:     Mental Status: She is alert.      UC Treatments / Results  Labs (all labs  ordered are listed, but only abnormal results are displayed) Labs Reviewed - No data to display  EKG   Radiology No results found.  Procedures Procedures (including critical care time)  Medications Ordered in UC Medications - No data to display  Initial Impression / Assessment and Plan / UC Course  I have reviewed the triage vital signs and the nursing notes.  Pertinent labs & imaging results that were available during my care of the patient were reviewed by me and considered in my medical decision making (see chart for details).   Patient is a pleasant, though ill-appearing 42 year old female who presents with continued respiratory complaints to include fatigue, productive cough, shortness breath, and chest pain.  She is also continued to run fevers.  She was treated with a 7-day course of Levaquin and is continuing to run fevers.  This is concerning for failure of outpatient therapy for pneumonia and I have therefore recommended that she go to the emergency department for evaluation and IV antibiotics.  She has elected to go via POV to [facility].  Her son is driving.   Final Clinical Impressions(s) / UC Diagnoses   Final diagnoses:  Community acquired pneumonia, unspecified laterality     Discharge Instructions      As we discussed, you completed a course of antibiotics and you are continuing to run fevers, have chest pain, have shortness of breath, and have a productive cough.  This is considered a failure of outpatient treatment for pneumonia.  Please go to [facility] for further evaluation and IV antibiotics.     ED Prescriptions   None    PDMP not reviewed this encounter.  Becky Augusta, NP 07/21/23 1857

## 2023-10-10 ENCOUNTER — Ambulatory Visit
Admission: EM | Admit: 2023-10-10 | Discharge: 2023-10-10 | Disposition: A | Discharge status: Home / Self Care | Payer: Commercial Managed Care - PPO

## 2023-10-10 ENCOUNTER — Encounter: Payer: Self-pay | Admitting: Emergency Medicine

## 2023-10-10 DIAGNOSIS — J329 Chronic sinusitis, unspecified: Secondary | ICD-10-CM

## 2023-10-10 DIAGNOSIS — B370 Candidal stomatitis: Secondary | ICD-10-CM | POA: Diagnosis not present

## 2023-10-10 MED ORDER — NYSTATIN 100000 UNIT/ML MT SUSP
OROMUCOSAL | 0 refills | Status: DC
Start: 2023-10-10 — End: 2024-02-13

## 2023-10-10 MED ORDER — DOXYCYCLINE HYCLATE 100 MG PO CAPS: 100.0000 mg | ORAL_CAPSULE | Freq: Two times a day (BID) | ORAL | 0 refills | Status: AC

## 2023-10-10 NOTE — ED Triage Notes (Signed)
Patient has had sinus congestion, pain and pressure that started a week ago.  Patient did a televisit on Sunday and was given Augmentin for sinus infection.  Patient states that her symptoms have not gotten better.  Patient reports white coating on her tongue.  Patient states that her tongue has gotten sore with the thrush.  Patient states that Augmentin does not work for her.  Patient states that Doxycycline is usually better for her.

## 2023-10-10 NOTE — ED Provider Notes (Signed)
[facility]    CSN: [id number] Arrival date & time: 10/10/23  1058      History   Chief Complaint Chief Complaint  Patient presents with   Sinus Problem    HPI Sandra Sanders is a 43 y.o. female with history of asthma, diabetes, GERD, and thyroid disease.  She follows with ENT for chronic rhinosinusitis requiring frequent antibiotics. She is scheduled to undergo sinus surgery in February.   Patient presents today for a couple week history of sinus congestion/pain and pressure with nasal congestion.  Also reports white coating on her tongue consistent with thrush.  She does use corticosteroid inhaler for asthma.  She denies fever, cough, chest pain or wheezing.  Patient seen recently by primary care 6 days ago.  Given Augmentin for sinusitis per patient.  States that does not usually work well for her and she requests doxycycline. She has been using flonase but no other meds for sinus symptoms.  HPI  Past Medical History:  Diagnosis Date   Anemia    Anxiety    Arthritis    Asthma    Cancer (HCC)    Cervical Cancer   COVID-19 09/2019   Diabetes mellitus without complication (HCC)    GERD (gastroesophageal reflux disease)    Headache    PFO (patent foramen ovale)    as an infant   Thyroid disease     Patient Active Problem List   Diagnosis Date Noted   Chalazion of left lower eyelid 02/10/2023   Viral illness 02/10/2023   Otalgia of both ears 02/10/2023   Hx MRSA infection 01/08/2022   Pain in joint of right ankle 07/09/2021   Contusion of right ankle 07/09/2021   Muscle strain of lower extremity 06/27/2021   Migraine 06/27/2021   Non-seasonal allergic rhinitis 01/22/2021   Epigastric pain 12/27/2020   B12 deficiency 09/23/2018   Degenerative disc disease at L5-S1 level 12/30/2017   Gastroesophageal reflux disease 05/26/2017   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   Hematochezia 11/19/2016   Morbid  obesity (HCC) 04/22/2016   Iron deficiency 03/18/2016   Helicobacter pylori gastrointestinal tract infection 01/18/2016   Vitamin D deficiency 01/18/2016   Fatigue 12/18/2015   Malaise 12/18/2015   Snoring 12/18/2015   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   Thyroid disease 02/23/2014   Syncope 12/09/2013   Insomnia 09/19/2013   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      Home Medications    Prior to Admission medications   Medication Sig Start Date End Date Taking? Authorizing Provider  BREYNA 160-4.5 MCG/ACT inhaler Inhale into the lungs. 10/05/23 10/04/24 Yes [provider]  buPROPion (WELLBUTRIN) 100 MG tablet Take by mouth. 05/22/22  Yes [provider]  cyanocobalamin (VITAMIN B12) 1000 MCG/ML injection Inject into  the skin. 12/10/22 07/14/24 Yes [provider]  doxycycline (VIBRAMYCIN) 100 MG capsule Take 1 capsule (100 mg total) by mouth 2 (two) times daily for 7 days. 10/10/23 10/17/23 Yes Shirlee Latch, PA-C  estradiol (CLIMARA - DOSED IN MG/24 HR) 0.075 mg/24hr patch Place onto the skin. 06/29/23  Yes [provider]  fluticasone (FLONASE) 50 MCG/ACT nasal spray 2 sprays into each nostril daily at 0600. 05/02/21  Yes [provider]  furosemide (LASIX) 20 MG tablet Take 1 tablet by mouth daily. 04/18/22   Yes [provider]  levothyroxine (SYNTHROID) 150 MCG tablet Take by mouth. 04/21/23  Yes [provider]  nystatin (MYCOSTATIN) 100000 UNIT/ML suspension Swish 5 ml in mouth and retain as tolerated QID and 72 hr after symptom resolution 10/10/23  Yes Eusebio Friendly B, PA-C  pantoprazole (PROTONIX) 40 MG tablet Take 40 mg by mouth 2 (two) times daily. 06/21/21  Yes [provider]  potassium chloride (KLOR-CON M) 10 MEQ tablet Take by mouth. 07/22/23 07/21/24 Yes [provider]  pregabalin (LYRICA) 75 MG capsule Take 75 mg by mouth 2 (two) times daily. 04/09/22  Yes [provider]  SUMAtriptan (IMITREX) 50 MG tablet Take 1-2 tablets (50-100 mg total) by mouth once for 1 dose. May repeat in 2 hours if headache persists or recurs. 10/31/20  Yes [provider]  testosterone cypionate (DEPOTESTOSTERONE CYPIONATE) 200 MG/ML injection Inject into the muscle. 06/29/23  Yes [provider]  tiZANidine (ZANAFLEX) 4 MG tablet Take 1 tablet by mouth every 6 (six) hours. 09/24/23  Yes [provider]  topiramate (TOPAMAX) 25 MG tablet Take 25 mg by mouth 2 (two) times daily.   Yes [provider]  Vilazodone HCl (VIIBRYD) 10 MG TABS Take 10 mg by mouth daily. 08/25/23  Yes [provider]  acetaminophen (TYLENOL) 500 MG tablet Take 1,000 mg by mouth every 6 (six) hours as needed.    [provider]  albuterol (VENTOLIN HFA) 108 (90 Base) MCG/ACT inhaler Inhale 2 puffs into the lungs every 4 (four) hours as needed. 07/15/23   Becky Augusta, NP  ALPRAZolam Prudy Feeler) 0.5 MG tablet Take 0.5-1 mg by mouth 3 (three) times daily as needed. 06/03/21   [provider]  amphetamine-dextroamphetamine (ADDERALL) 20 MG tablet Take 20 mg by mouth 4 (four) times daily. 06/14/21   [provider]  aspirin EC 81 MG tablet Take 81 mg by mouth daily. Swallow whole.    [provider]  clobetasol cream (TEMOVATE) 0.05 %  1(ONE) APPLICATION(S) TOPICAL 2(TWO) TIMES A DAY 10/25/21   [provider]  cyclobenzaprine (FLEXERIL) 5 MG tablet Take 1 tablet (5 mg total) by mouth 3 (three) times daily as needed for muscle spasms. 05/09/22   White, Elita Boone, NP  escitalopram (LEXAPRO) 20 MG tablet Take 20 mg by mouth daily. 04/02/22   [provider]  gabapentin (NEURONTIN) 600 MG tablet Take 600 mg by mouth 2 (two) times daily. 03/09/22   [provider]  hydroxychloroquine (PLAQUENIL) 200 MG tablet Take by mouth.    [provider]  meloxicam (MOBIC) 7.5 MG tablet Take 1 tablet (7.5 mg total) by mouth daily. 05/09/22   White, Elita Boone, NP  promethazine-dextromethorphan (PROMETHAZINE-DM) 6.25-15 MG/5ML syrup Take 5 mLs by mouth 4 (four) times daily as needed. 07/15/23   Becky Augusta, NP  Spacer/Aero-Holding Chambers (AEROCHAMBER MV) inhaler Use as instructed 07/15/23   Becky Augusta, NP  thyroid (ARMOUR) 60 MG tablet Take 60 mg  by mouth 2 (two) times daily.    [provider]  traZODone (DESYREL) 150 MG tablet Take 150 mg by mouth at bedtime.    [provider]    Family History Family History  Problem Relation Age of Onset   Hypertension Mother    Hypertension Father    Cancer Other     Social History Social History   Tobacco Use   Smoking status: Former    Current packs/day: 0.00    Types: Cigarettes    Quit date: 03/22/2004    Years since quitting: 19.5   Smokeless tobacco: Never  Vaping Use   Vaping status: Never Used  Substance Use Topics   Alcohol use: No   Drug use: No     Allergies   Hydromorphone, Hydromorphone hcl, Other, Hydrocodone, Tramadol, Hydrocodone-acetaminophen, Banana, Citrullus vulgaris, Daucus carota, Lidocaine, Miconazole, and Tioconazole   Review of Systems Review of Systems  Constitutional:  Positive for fatigue. Negative for chills, diaphoresis and fever.  HENT:  Positive for congestion, ear pain, postnasal drip, rhinorrhea,  sinus pressure and sinus pain. Negative for ear discharge and sore throat.   Respiratory:  Negative for cough and shortness of breath.   Cardiovascular:  Negative for chest pain.  Gastrointestinal:  Negative for abdominal pain, nausea and vomiting.  Musculoskeletal:  Negative for arthralgias and myalgias.  Skin:  Negative for rash.  Neurological:  Negative for weakness and headaches.  Hematological:  Negative for adenopathy.     Physical Exam Triage Vital Signs ED Triage Vitals  Encounter Vitals Group     BP 10/10/23 1122 96/64     Systolic BP Percentile --      Diastolic BP Percentile --      Pulse Rate 10/10/23 1122 68     Resp 10/10/23 1122 14     Temp 10/10/23 1122 97.9 F (36.6 C)     Temp Source 10/10/23 1122 Oral     SpO2 10/10/23 1122 100 %     Weight 10/10/23 1118 160 lb 0.9 oz (72.6 kg)     Height 10/10/23 1118 5\' 9"  (1.753 m)     Head Circumference --      Peak Flow --      Pain Score 10/10/23 1117 9     Pain Loc --      Pain Education --      Exclude from Growth Chart --    No data found.  Updated Vital Signs BP 96/64 (BP Location: Left Arm)   Pulse 68   Temp 97.9 F (36.6 C) (Oral)   Resp 14   Ht 5\' 9"  (1.753 m)   Wt 160 lb 0.9 oz (72.6 kg)   SpO2 100%   BMI 23.64 kg/m       Physical Exam Vitals and nursing note reviewed.  Constitutional:      General: She is not in acute distress.    Appearance: Normal appearance. She is ill-appearing. She is not toxic-appearing.  HENT:     Head: Normocephalic and atraumatic.     Right Ear: Tympanic membrane, ear canal and external ear normal.     Left Ear: Tympanic membrane, ear canal and external ear normal.     Nose: Congestion present.     Mouth/Throat:     Mouth: Mucous membranes are moist.     Pharynx: Oropharynx is clear.     Comments: Thick whitish yellow material on tongue Eyes:     General: No scleral icterus.  Right eye: No discharge.        Left eye: No discharge.      Conjunctiva/sclera: Conjunctivae normal.  Cardiovascular:     Rate and Rhythm: Normal rate and regular rhythm.     Heart sounds: Normal heart sounds.  Pulmonary:     Effort: Pulmonary effort is normal. No respiratory distress.     Breath sounds: Normal breath sounds.  Musculoskeletal:     Cervical back: Neck supple.  Skin:    General: Skin is dry.  Neurological:     General: No focal deficit present.     Mental Status: She is alert. Mental status is at baseline.     Motor: No weakness.     Gait: Gait normal.  Psychiatric:        Mood and Affect: Mood normal.        Behavior: Behavior normal.      UC Treatments / Results  Labs (all labs ordered are listed, but only abnormal results are displayed) Labs Reviewed - No data to display  EKG   Radiology No results found.  Procedures Procedures (including critical care time)  Medications Ordered in UC Medications - No data to display  Initial Impression / Assessment and Plan / UC Course  I have reviewed the triage vital signs and the nursing notes.  Pertinent labs & imaging results that were available during my care of the patient were reviewed by me and considered in my medical decision making (see chart for details).   43 year old female with history of asthma, diabetes, GERD and recurrent sinusitis presents for continued sinus pain/pressure and nasal congestion.  Seen by primary care 6 days ago and placed on Augmentin.  She says that normally does not work for her and she wants to be switched to doxycycline.  Has an upcoming appointment in February for sinus surgery.  History of septoplasty.  Vitals are all normal and stable.  Appears ill but nontoxic.  On exam has nasal congestion.  Throat is clear.  Thick whitish-yellow material on tongue.  Chest clear.  Acute on chronic sinusitis.  Treating at this time with doxycycline.  Suspected thrush.  Treating with nystatin.  Encouraged continuing Flonase and starting a  decongestant and saline sinus rinses.  Follow-up with ENT as scheduled.  Acute illness.  Flareup of chronic underlying condition.   Final Clinical Impressions(s) / UC Diagnoses   Final diagnoses:  Chronic sinusitis, unspecified location  Thrush   Discharge Instructions   None    ED Prescriptions     Medication Sig Dispense Auth. Provider   doxycycline (VIBRAMYCIN) 100 MG capsule Take 1 capsule (100 mg total) by mouth 2 (two) times daily for 7 days. 14 capsule Eusebio Friendly B, PA-C   nystatin (MYCOSTATIN) 100000 UNIT/ML suspension Swish 5 ml in mouth and retain as tolerated QID and 72 hr after symptom resolution 473 mL Shirlee Latch, PA-C      PDMP not reviewed this encounter.   Shirlee Latch, PA-C 10/10/23 1222

## 2024-01-05 ENCOUNTER — Encounter: Payer: Self-pay | Admitting: Surgery

## 2024-01-06 ENCOUNTER — Ambulatory Visit: Admitting: Anesthesiology

## 2024-01-06 ENCOUNTER — Encounter
Admission: RE | Disposition: A | Discharge status: Home / Self Care | Payer: Self-pay | Source: Home / Self Care | Attending: Surgery

## 2024-01-06 ENCOUNTER — Encounter: Payer: Self-pay | Admitting: Surgery

## 2024-01-06 ENCOUNTER — Ambulatory Visit
Admission: RE | Admit: 2024-01-06 | Discharge: 2024-01-06 | Disposition: A | Discharge status: Home / Self Care | Attending: Surgery | Admitting: Surgery

## 2024-01-06 ENCOUNTER — Other Ambulatory Visit: Payer: Self-pay

## 2024-01-06 DIAGNOSIS — J449 Chronic obstructive pulmonary disease, unspecified: Secondary | ICD-10-CM | POA: Diagnosis not present

## 2024-01-06 DIAGNOSIS — K644 Residual hemorrhoidal skin tags: Secondary | ICD-10-CM | POA: Diagnosis not present

## 2024-01-06 DIAGNOSIS — Z7984 Long term (current) use of oral hypoglycemic drugs: Secondary | ICD-10-CM | POA: Diagnosis not present

## 2024-01-06 DIAGNOSIS — Z87891 Personal history of nicotine dependence: Secondary | ICD-10-CM | POA: Diagnosis not present

## 2024-01-06 DIAGNOSIS — K640 First degree hemorrhoids: Secondary | ICD-10-CM | POA: Diagnosis present

## 2024-01-06 DIAGNOSIS — K219 Gastro-esophageal reflux disease without esophagitis: Secondary | ICD-10-CM | POA: Insufficient documentation

## 2024-01-06 DIAGNOSIS — K5900 Constipation, unspecified: Secondary | ICD-10-CM | POA: Diagnosis not present

## 2024-01-06 DIAGNOSIS — E119 Type 2 diabetes mellitus without complications: Secondary | ICD-10-CM | POA: Insufficient documentation

## 2024-01-06 HISTORY — PX: COLONOSCOPY: SHX5424

## 2024-01-06 SURGERY — COLONOSCOPY
Anesthesia: General | Site: Rectum

## 2024-01-06 MED ORDER — SODIUM CHLORIDE 0.9 % IV SOLN: INTRAVENOUS | Status: DC

## 2024-01-06 NOTE — Anesthesia Preprocedure Evaluation (Addendum)
 Anesthesia Evaluation  Patient identified by MRN, date of birth, ID band Patient awake    Reviewed: Allergy & Precautions, H&P , NPO status , Patient's Chart, lab work & pertinent test results  Airway Mallampati: II  TM Distance: >3 FB Neck ROM: full    Dental no notable dental hx.    Pulmonary asthma , former smoker   Pulmonary exam normal        Cardiovascular negative cardio ROS Normal cardiovascular exam     Neuro/Psych   Anxiety     negative neurological ROS  negative psych ROS   GI/Hepatic Neg liver ROS,GERD  Controlled,,  Endo/Other  diabetes    Renal/GU negative Renal ROS  negative genitourinary   Musculoskeletal   Abdominal Normal abdominal exam  (+)   Peds  Hematology negative hematology ROS (+)   Anesthesia Other Findings Past Medical History: No date: Anemia No date: Anxiety No date: Arthritis No date: Asthma No date: Cancer [facility])     Comment:  Cervical Cancer 09/2019: COVID-19 No date: Diabetes mellitus without complication (HCC) No date: GERD (gastroesophageal reflux disease) No date: Headache No date: PFO (patent foramen ovale)     Comment:  as an infant No date: Thyroid disease  Past Surgical History: No date: ABDOMINAL HYSTERECTOMY 2000: AUGMENTATION MAMMAPLASTY; Bilateral No date: CHOLECYSTECTOMY No date: KNEE SURGERY; Left 04/22/2016: LAPAROSCOPIC GASTRIC SLEEVE RESECTION; N/A     Comment:  Procedure: LAPAROSCOPIC GASTRIC SLEEVE RESECTION;                Surgeon: Baldomero Levans, MD;  Location: [facility] ORS;  Service:               General;  Laterality: N/A; No date: SEPTOPLASTY     Reproductive/Obstetrics negative OB ROS                              Anesthesia Physical Anesthesia Plan  ASA: 2  Anesthesia Plan: General   Post-op Pain Management:    Induction: Intravenous  PONV Risk Score and Plan: Propofol infusion and TIVA  Airway Management  Planned: Natural Airway  Additional Equipment:   Intra-op Plan:   Post-operative Plan:   Informed Consent: I have reviewed the patients History and Physical, chart, labs and discussed the procedure including the risks, benefits and alternatives for the proposed anesthesia with the patient or authorized representative who has indicated his/her understanding and acceptance.     Dental Advisory Given  Plan Discussed with: CRNA and Surgeon  Anesthesia Plan Comments:          Anesthesia Quick Evaluation

## 2024-01-06 NOTE — Transfer of Care (Signed)
 Immediate Anesthesia Transfer of Care Note  Patient: Sandra Sanders  Procedure(s) Performed: COLONOSCOPY (Rectum)  Patient Location: Endoscopy Unit  Anesthesia Type:General  Level of Consciousness: drowsy  Airway & Oxygen Therapy: Patient Spontanous Breathing  Post-op Assessment: Report given to RN and Post -op Vital signs reviewed and stable  Post vital signs: Reviewed and stable  Last Vitals:  Vitals Value Taken Time  BP 86/50 01/06/24 0851  Temp    Pulse 61 01/06/24 0851  Resp 16 01/06/24 0851  SpO2 100 % 01/06/24 0851    Last Pain:  Vitals:   01/06/24 0851  TempSrc:   PainSc: 0-No pain         Complications: No notable events documented.

## 2024-01-06 NOTE — Op Note (Signed)
 [facility] Patient Name: Sandra Sanders Procedure Date: 01/06/2024 8:04 AM MRN: [id number] Account #: [id number] Date of Birth: [birth year] Admit Type: Outpatient Age: 43 Room: [facility] ENDO ROOM 4 Gender: Female Note Status: Finalized Instrument Name: Hyman Main [id number] Procedure:             Colonoscopy Indications:           Constipation Providers:             Conrado Delay MD, MD Referring MD:          Lenell Query (Referring MD) Medicines:             Propofol per Anesthesia Complications:         No immediate complications. Procedure:             Pre-Anesthesia Assessment:                        - After reviewing the risks and benefits, the patient                         was deemed in satisfactory condition to undergo the                         procedure in an ambulatory setting.                        After obtaining informed consent, the colonoscope was                         passed under direct vision. Throughout the procedure,                         the patient's blood pressure, pulse, and oxygen                         saturations were monitored continuously. The                         Colonoscope was introduced through the anus and                         advanced to the the terminal ileum, with                         identification of the ileocecal valve. The colonoscopy                         was performed with moderate difficulty due to poor                         bowel prep with stool present. Successful completion                         of the procedure was aided by lavage. Findings:      Skin tags were found on perianal exam.      Non-bleeding internal hemorrhoids were found during retroflexion. The       hemorrhoids were Grade I (internal hemorrhoids that do not prolapse). Impression:            -  Perianal skin tags found on perianal exam.                        - Non-bleeding internal hemorrhoids.                         - No specimens collected. Recommendation:        - Repeat colonoscopy in 6 months for surveillance due                         to poor prep and possiblity of hiding smaller polyps                         already present                        - Written discharge instructions were provided to the                         patient.                        - Discharge patient to home.                        - Resume previous diet. Procedure Code(s):     --- Professional ---                        [id number], Colonoscopy, flexible; diagnostic, including                         collection of specimen(s) by brushing or washing, when                         performed (separate procedure) Diagnosis Code(s):     --- Professional ---                        K64.0, First degree hemorrhoids                        K64.4, Residual hemorrhoidal skin tags                        K59.00, Constipation, unspecified CPT copyright 2022 [employer]. All rights reserved. The codes documented in this report are preliminary and upon coder review may  be revised to meet current compliance requirements. Dr. Ward Guy, MD Conrado Delay MD, MD 01/06/2024 8:50:11 AM This report has been signed electronically. Number of Addenda: 0 Note Initiated On: 01/06/2024 8:04 AM Scope Withdrawal Time: 0 hours 12 minutes 16 seconds  Total Procedure Duration: 0 hours 28 minutes 55 seconds  Estimated Blood Loss:  Estimated blood loss: none.      [facility]
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
## 2024-01-07 ENCOUNTER — Encounter: Payer: Self-pay | Admitting: Surgery

## 2024-01-11 ENCOUNTER — Ambulatory Visit (INDEPENDENT_AMBULATORY_CARE_PROVIDER_SITE_OTHER)

## 2024-01-11 ENCOUNTER — Ambulatory Visit
Admission: EM | Admit: 2024-01-11 | Discharge: 2024-01-11 | Disposition: A | Discharge status: Home / Self Care | Attending: Physician Assistant | Admitting: Physician Assistant

## 2024-01-11 ENCOUNTER — Encounter: Payer: Self-pay | Admitting: Physician Assistant

## 2024-01-11 DIAGNOSIS — R0602 Shortness of breath: Secondary | ICD-10-CM

## 2024-01-11 DIAGNOSIS — R0781 Pleurodynia: Secondary | ICD-10-CM | POA: Diagnosis present

## 2024-01-11 DIAGNOSIS — J45901 Unspecified asthma with (acute) exacerbation: Secondary | ICD-10-CM

## 2024-01-11 DIAGNOSIS — R058 Other specified cough: Secondary | ICD-10-CM

## 2024-01-11 DIAGNOSIS — J22 Unspecified acute lower respiratory infection: Secondary | ICD-10-CM

## 2024-01-11 LAB — RESP PANEL BY RT-PCR (FLU A&B, COVID) ARPGX2
Influenza A by PCR: NEGATIVE
Influenza B by PCR: NEGATIVE
SARS Coronavirus 2 by RT PCR: NEGATIVE

## 2024-01-11 MED ORDER — PROMETHAZINE-DM 6.25-15 MG/5ML PO SYRP: 5.0000 mL | ORAL_SOLUTION | Freq: Four times a day (QID) | ORAL | 0 refills | Status: DC | PRN

## 2024-01-11 MED ORDER — PREDNISONE 20 MG PO TABS: 40.0000 mg | ORAL_TABLET | Freq: Every day | ORAL | 0 refills | Status: AC

## 2024-01-11 NOTE — Discharge Instructions (Addendum)
-   Negative COVID and flu testing. - An x-ray was obtained today.  Initial reading does not show obvious pneumonia but I will be read by radiologist and you will be contacted through MyChart with the results.  If necessary, antibiotics will be sent. - If x-ray is read as within normal limits this does not indicate need for antibiotics and symptoms would be more consistent with viral bronchitis which can last for a couple of weeks. - I sent prednisone  to the pharmacy to help with asthma flareup and also cough medicine.  Use your inhalers at home.  Increase rest and fluids. - If pain worsens or you feel more short of breath, fever develops, weakness, etc. please consider going to emergency department for further evaluation.

## 2024-01-11 NOTE — ED Triage Notes (Addendum)
  Sx started yesterday Cough that makes chest hurt Sob Fatigue

## 2024-01-11 NOTE — ED Provider Notes (Signed)
 [facility]    CSN: [id number] Arrival date & time: 01/11/24  0848      History   Chief Complaint Chief Complaint  Patient presents with   Shortness of Breath   Cough   Fatigue    HPI Sandra Sanders is a 43 y.o. female with history of asthma, diabetes, GERD, and thyroid disease.  She follows with ENT for chronic rhinosinusitis requiring frequent antibiotics. She had a sinus surgery in February this year.   Patient presents today for onset of fatigue, productive cough, runny nose, shortness of breath, and left sided pleuritic pain yesterday. Denies body aches, sore throat, ear pain, sinus pain, abdominal pain, vomiting or diarrhea.  Patient reports that her father recently got out of the hospital for pneumonia.  She also states that her son has been ill and called a telemedicine provider and received an antibiotic.  States he is doing better.  She has been using albuterol  inhaler and taking ibuprofen  and cough medicine over-the-counter.  No history of PE or DVT.  HPI  Past Medical History:  Diagnosis Date   Anemia    Anxiety    Arthritis    Asthma    Cancer (HCC)    Cervical Cancer   COVID-19 09/2019   GERD (gastroesophageal reflux disease)    Headache    PFO (patent foramen ovale)    as an infant   Thyroid disease     Patient Active Problem List   Diagnosis Date Noted   Chalazion of left lower eyelid 02/10/2023   Viral illness 02/10/2023   Otalgia of both ears 02/10/2023   Hx MRSA infection 01/08/2022   Pain in joint of right ankle 07/09/2021   Contusion of right ankle 07/09/2021   Muscle strain of lower extremity 06/27/2021   Migraine 06/27/2021   Non-seasonal allergic rhinitis 01/22/2021   Epigastric pain 12/27/2020   B12 deficiency 09/23/2018   Degenerative disc disease at L5-S1 level 12/30/2017   Gastroesophageal reflux disease 05/26/2017   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   Hematochezia  11/19/2016   Morbid obesity (HCC) 04/22/2016   Iron deficiency 03/18/2016   Helicobacter pylori gastrointestinal tract infection 01/18/2016   Vitamin D deficiency 01/18/2016   Fatigue 12/18/2015   Malaise 12/18/2015   Snoring 12/18/2015   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   Thyroid disease 02/23/2014   Syncope 12/09/2013   Insomnia 09/19/2013   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   Left knee pain 06/30/2013   ANA positive 03/08/2013   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   Edema 10/16/2011   Anxiety 09/17/2011   Asthma, well controlled 06/25/2011    Past Surgical History:  Procedure Laterality Date   ABDOMINAL HYSTERECTOMY     AUGMENTATION MAMMAPLASTY Bilateral 2000   CHOLECYSTECTOMY     COLONOSCOPY N/A 01/06/2024   Procedure: COLONOSCOPY;  Surgeon: Conrado Delay, DO;  Location: [facility];  Service: General;  Laterality: N/A;   KNEE SURGERY Left    LAPAROSCOPIC GASTRIC SLEEVE RESECTION N/A 04/22/2016   Procedure: LAPAROSCOPIC GASTRIC SLEEVE RESECTION;  Surgeon: Baldomero Levans, MD;  Location: [facility] ORS;  Service: General;  Laterality: N/A;   SEPTOPLASTY      OB History   No obstetric history on file.      Home Medications    Prior to Admission medications   Medication Sig Start Date End Date Taking? Authorizing Provider  acetaminophen  (TYLENOL ) 500 MG tablet Take 1,000 mg  by mouth every 6 (six) hours as needed.   Yes [provider]  albuterol  (VENTOLIN  HFA) 108 (90 Base) MCG/ACT inhaler Inhale 2 puffs into the lungs every 4 (four) hours as needed. 07/15/23  Yes Kent Pear, NP  ALPRAZolam (XANAX) 0.5 MG tablet Take 0.5-1 mg by mouth 3 (three) times daily as needed. 06/03/21  Yes [provider]  amphetamine-dextroamphetamine (ADDERALL) 20 MG tablet Take 20 mg by mouth 4 (four) times daily. 06/14/21  Yes [provider]  aspirin EC 81 MG tablet Take 81 mg by mouth daily. Swallow whole.   Yes  [provider]  BREYNA 160-4.5 MCG/ACT inhaler Inhale into the lungs. 10/05/23 10/04/24 Yes [provider]  buPROPion (WELLBUTRIN) 100 MG tablet Take by mouth. 05/22/22  Yes [provider]  clobetasol cream (TEMOVATE) 0.05 % 1(ONE) APPLICATION(S) TOPICAL 2(TWO) TIMES A DAY 10/25/21  Yes [provider]  cyanocobalamin (VITAMIN B12) 1000 MCG/ML injection Inject into the skin. 12/10/22 07/14/24 Yes [provider]  cyclobenzaprine  (FLEXERIL ) 5 MG tablet Take 1 tablet (5 mg total) by mouth 3 (three) times daily as needed for muscle spasms. 05/09/22  Yes White, Adrienne R, NP  escitalopram (LEXAPRO) 20 MG tablet Take 20 mg by mouth daily. 04/02/22  Yes [provider]  estradiol (CLIMARA - DOSED IN MG/24 HR) 0.075 mg/24hr patch Place onto the skin. 06/29/23  Yes [provider]  fluticasone (FLONASE) 50 MCG/ACT nasal spray 2 sprays into each nostril daily at 0600. 05/02/21  Yes [provider]  furosemide (LASIX) 20 MG tablet Take 1 tablet by mouth daily. 04/18/22  Yes [provider]  hydroxychloroquine (PLAQUENIL) 200 MG tablet Take by mouth.   Yes [provider]  levothyroxine (SYNTHROID) 150 MCG tablet Take by mouth. 04/21/23  Yes [provider]  meloxicam  (MOBIC ) 7.5 MG tablet Take 1 tablet (7.5 mg total) by mouth daily. 05/09/22  Yes White, Maybelle Spatz, NP  nystatin  (MYCOSTATIN ) 100000 UNIT/ML suspension Swish 5 ml in mouth and retain as tolerated QID and 72 hr after symptom resolution 10/10/23  Yes Nancy Axon B, PA-C  pantoprazole (PROTONIX) 40 MG tablet Take 40 mg by mouth 2 (two) times daily. 06/21/21  Yes [provider]  potassium chloride  (KLOR-CON  M) 10 MEQ tablet Take by mouth. 07/22/23 07/21/24 Yes [provider]  predniSONE  (DELTASONE ) 20 MG tablet Take 2 tablets (40 mg total) by mouth daily for 5 days. 01/11/24 01/16/24 Yes Floydene Hy, PA-C  pregabalin (LYRICA) 75 MG capsule  Take 75 mg by mouth 2 (two) times daily. 04/09/22  Yes [provider]  promethazine -dextromethorphan (PROMETHAZINE -DM) 6.25-15 MG/5ML syrup Take 5 mLs by mouth 4 (four) times daily as needed. 01/11/24  Yes Floydene Hy, PA-C  Spacer/Aero-Holding Chambers (AEROCHAMBER MV) inhaler Use as instructed 07/15/23  Yes Kent Pear, NP  SUMAtriptan  (IMITREX ) 50 MG tablet Take 1-2 tablets (50-100 mg total) by mouth once for 1 dose. May repeat in 2 hours if headache persists or recurs. 10/31/20  Yes [provider]  testosterone cypionate (DEPOTESTOSTERONE CYPIONATE) 200 MG/ML injection Inject into the muscle. 06/29/23  Yes [provider]  thyroid (ARMOUR) 60 MG tablet Take 60 mg by mouth 2 (two) times daily.   Yes [provider]  tiZANidine (ZANAFLEX) 4 MG tablet Take 1 tablet by mouth every 6 (six) hours. 09/24/23  Yes [provider]  topiramate (TOPAMAX) 25 MG tablet Take 25 mg by mouth 2 (two) times daily.   Yes [provider]  traZODone (DESYREL) 150 MG tablet Take 150 mg by mouth at bedtime.   Yes [provider]  Vilazodone HCl (VIIBRYD) 10 MG TABS Take 10 mg by mouth daily. 08/25/23  Yes [provider]  gabapentin (NEURONTIN) 600 MG tablet Take 600 mg by mouth 2 (two) times daily. 03/09/22   [provider]    Family History Family History  Problem Relation Age of Onset   Hypertension Mother    Hypertension Father    Cancer Other     Social History Social History   Tobacco Use   Smoking status: Former    Current packs/day: 0.00    Types: Cigarettes    Quit date: 03/22/2004    Years since quitting: 19.8   Smokeless tobacco: Never  Vaping Use   Vaping status: Never Used  Substance Use Topics   Alcohol use: No   Drug use: No     Allergies   Hydromorphone, Hydromorphone hcl, Other, Hydrocodone, Tramadol, Hydrocodone-acetaminophen , Banana, Citrullus vulgaris, Daucus carota, Lidocaine , Miconazole, and  Tioconazole   Review of Systems Review of Systems  Constitutional:  Positive for fatigue. Negative for chills, diaphoresis and fever.  HENT:  Positive for congestion and rhinorrhea. Negative for ear discharge, ear pain, postnasal drip, sinus pressure, sinus pain and sore throat.   Respiratory:  Positive for cough and shortness of breath.   Cardiovascular:  Positive for chest pain.  Gastrointestinal:  Negative for abdominal pain, nausea and vomiting.  Musculoskeletal:  Negative for myalgias.  Skin:  Negative for rash.  Neurological:  Negative for weakness and headaches.  Hematological:  Negative for adenopathy.     Physical Exam Triage Vital Signs ED Triage Vitals  Encounter Vitals Group     BP 10/10/23 1122 96/64     Systolic BP Percentile --      Diastolic BP Percentile --      Pulse Rate 10/10/23 1122 68     Resp 10/10/23 1122 14     Temp 10/10/23 1122 97.9 F (36.6 C)     Temp Source 10/10/23 1122 Oral     SpO2 10/10/23 1122 100 %     Weight 10/10/23 1118 160 lb 0.9 oz (72.6 kg)     Height 10/10/23 1118 5\' 9"  (1.753 m)     Head Circumference --      Peak Flow --      Pain Score 10/10/23 1117 9     Pain Loc --      Pain Education --      Exclude from Growth Chart --    No data found.  Updated Vital Signs BP 97/62 (BP Location: Right Arm)   Pulse 88   Temp (!) 97.4 F (36.3 C) (Oral)   Resp 14   SpO2 95%       Physical Exam Vitals and nursing note reviewed.  Constitutional:      General: She is not in acute distress.    Appearance: Normal appearance. She is ill-appearing. She is not toxic-appearing.  HENT:     Head: Normocephalic and atraumatic.     Right Ear: Tympanic membrane, ear canal and external ear normal.     Left Ear: Tympanic membrane, ear canal and external ear normal.     Nose: Congestion present.     Mouth/Throat:     Mouth: Mucous membranes are moist.     Pharynx: Oropharynx is clear.  Eyes:     General: No scleral icterus.       Right  eye: No discharge.        Left eye: No discharge.     Conjunctiva/sclera: Conjunctivae normal.  Cardiovascular:     Rate and Rhythm: Normal rate and regular rhythm.     Heart sounds: Normal heart sounds.  Pulmonary:     Effort: Pulmonary effort is normal. No respiratory distress.     Breath sounds: Normal breath sounds.  Musculoskeletal:     Cervical back: Neck supple.  Skin:    General: Skin is dry.  Neurological:     General: No focal deficit present.     Mental Status: She is alert. Mental status is at baseline.     Motor: No weakness.     Gait: Gait normal.  Psychiatric:        Mood and Affect: Mood normal.        Behavior: Behavior normal.      UC Treatments / Results  Labs (all labs ordered are listed, but only abnormal results are displayed) Labs Reviewed  RESP PANEL BY RT-PCR (FLU A&B, COVID) [id number]    EKG   Radiology DG Chest 2 View Result Date: 01/11/2024 CLINICAL DATA:  Cough. EXAM: CHEST - 2 VIEW COMPARISON:  07/15/2023. FINDINGS: The heart size and mediastinal contours are within normal limits. No focal consolidation, pleural effusion, or pneumothorax. No acute osseous abnormality. IMPRESSION: No acute cardiopulmonary findings. Electronically Signed   By: Mannie Seek M.D.   On: 01/11/2024 11:36    Procedures Procedures (including critical care time)  Medications Ordered in UC Medications - No data to display  Initial Impression / Assessment and Plan / UC Course  I have reviewed the triage vital signs and the nursing notes.  Pertinent labs & imaging results that were available during my care of the patient were reviewed by me and considered in my medical decision making (see chart for details).   43 year old female with history of asthma, diabetes, GERD and recurrent sinusitis presents for fatigue, shortness of breath, productive cough, left-sided pleuritic pain since yesterday morning.  States her father was recently diagnosed with pneumonia and  she has fears of this.  No history of PE or DVT.  Vitals are all normal and stable.  Appears ill but nontoxic.  On exam has nasal congestion.  Throat is clear.  Chest clear.  Heart regular rate rhythm.  Respiratory panel and chest x-ray obtained.  Respiratory panel negative.  Reviewed results with patient.  Wet read chest x-ray negative.  Reviewed this with her.  Advised to contact her with results or MyChart.  If she has pneumonia we will send antibiotics.  Presentation consistent with acute bronchitis and asthma exacerbation.  Will treat at this time with Promethazine  DM, prednisone  and have her use albuterol  at home and increase rest and fluids.  Reviewed going to ED for any fever or acute worsening of condition.  Patient is agreeable.  Acute illness.  Flareup of chronic underlying condition.   Final Clinical Impressions(s) / UC Diagnoses   Final diagnoses:  Shortness of breath  Lower resp. tract infection  Productive cough  Pleuritic pain  Asthma with acute exacerbation, unspecified asthma severity, unspecified whether persistent     Discharge Instructions      - Negative COVID and flu testing. - An x-ray was obtained today.  Initial reading does not show obvious pneumonia but I will be read by radiologist and you will be contacted through MyChart with the results.  If necessary, antibiotics will be sent. - If x-ray is  read as within normal limits this does not indicate need for antibiotics and symptoms would be more consistent with viral bronchitis which can last for a couple of weeks. - I sent prednisone  to the pharmacy to help with asthma flareup and also cough medicine.  Use your inhalers at home.  Increase rest and fluids. - If pain worsens or you feel more short of breath, fever develops, weakness, etc. please consider going to emergency department for further evaluation.     ED Prescriptions     Medication Sig Dispense Auth. Provider   predniSONE  (DELTASONE ) 20 MG  tablet Take 2 tablets (40 mg total) by mouth daily for 5 days. 10 tablet Nancy Axon B, PA-C   promethazine -dextromethorphan (PROMETHAZINE -DM) 6.25-15 MG/5ML syrup Take 5 mLs by mouth 4 (four) times daily as needed. 118 mL Floydene Hy, PA-C      PDMP not reviewed this encounter.      Floydene Hy, PA-C 01/11/24 1213

## 2024-02-13 ENCOUNTER — Ambulatory Visit
Admission: EM | Admit: 2024-02-13 | Discharge: 2024-02-13 | Disposition: A | Discharge status: Home / Self Care | Attending: Emergency Medicine | Admitting: Emergency Medicine

## 2024-02-13 ENCOUNTER — Ambulatory Visit (INDEPENDENT_AMBULATORY_CARE_PROVIDER_SITE_OTHER)

## 2024-02-13 DIAGNOSIS — R051 Acute cough: Secondary | ICD-10-CM

## 2024-02-13 DIAGNOSIS — J189 Pneumonia, unspecified organism: Secondary | ICD-10-CM | POA: Diagnosis present

## 2024-02-13 LAB — CBC WITH DIFFERENTIAL/PLATELET
Abs Immature Granulocytes: 0.03 10*3/uL (ref 0.00–0.07)
Basophils Absolute: 0 10*3/uL (ref 0.0–0.1)
Basophils Relative: 0 %
Eosinophils Absolute: 0 10*3/uL (ref 0.0–0.5)
Eosinophils Relative: 0 %
HCT: 37.3 % (ref 36.0–46.0)
Hemoglobin: 12.8 g/dL (ref 12.0–15.0)
Immature Granulocytes: 0 %
Lymphocytes Relative: 4 %
Lymphs Abs: 0.4 10*3/uL — ABNORMAL LOW (ref 0.7–4.0)
MCH: 30.3 pg (ref 26.0–34.0)
MCHC: 34.3 g/dL (ref 30.0–36.0)
MCV: 88.4 fL (ref 80.0–100.0)
Monocytes Absolute: 0.4 10*3/uL (ref 0.1–1.0)
Monocytes Relative: 5 %
Neutro Abs: 7.4 10*3/uL (ref 1.7–7.7)
Neutrophils Relative %: 91 %
Platelets: 182 10*3/uL (ref 150–400)
RBC: 4.22 MIL/uL (ref 3.87–5.11)
RDW: 12.3 % (ref 11.5–15.5)
WBC: 8.2 10*3/uL (ref 4.0–10.5)
nRBC: 0 % (ref 0.0–0.2)

## 2024-02-13 LAB — COMPREHENSIVE METABOLIC PANEL WITH GFR
ALT: 117 U/L — ABNORMAL HIGH (ref 0–44)
AST: 210 U/L — ABNORMAL HIGH (ref 15–41)
Albumin: 3.7 g/dL (ref 3.5–5.0)
Alkaline Phosphatase: 123 U/L (ref 38–126)
Anion gap: 9 (ref 5–15)
BUN: 8 mg/dL (ref 6–20)
CO2: 22 mmol/L (ref 22–32)
Calcium: 8.6 mg/dL — ABNORMAL LOW (ref 8.9–10.3)
Chloride: 103 mmol/L (ref 98–111)
Creatinine, Ser: 0.6 mg/dL (ref 0.44–1.00)
GFR, Estimated: >60 mL/min (ref >60)
Glucose, Bld: 114 mg/dL — ABNORMAL HIGH (ref 70–99)
Potassium: 4 mmol/L (ref 3.5–5.1)
Sodium: 134 mmol/L — ABNORMAL LOW (ref 135–145)
Total Bilirubin: 0.8 mg/dL (ref 0.0–1.2)
Total Protein: 6 g/dL — ABNORMAL LOW (ref 6.5–8.1)

## 2024-02-13 LAB — RESP PANEL BY RT-PCR (FLU A&B, COVID) ARPGX2
Influenza A by PCR: NEGATIVE
Influenza B by PCR: NEGATIVE
SARS Coronavirus 2 by RT PCR: NEGATIVE

## 2024-02-13 MED ORDER — PROMETHAZINE-DM 6.25-15 MG/5ML PO SYRP: 5.0000 mL | ORAL_SOLUTION | Freq: Four times a day (QID) | ORAL | 0 refills | Status: DC | PRN

## 2024-02-13 MED ORDER — LEVOFLOXACIN 500 MG PO TABS
500.0000 mg | ORAL_TABLET | Freq: Every day | ORAL | 0 refills | Status: DC
Start: 2024-02-13 — End: 2024-08-08

## 2024-02-13 MED ORDER — BENZONATATE 100 MG PO CAPS: 200.0000 mg | ORAL_CAPSULE | Freq: Three times a day (TID) | ORAL | 0 refills | Status: DC

## 2024-02-13 NOTE — ED Provider Notes (Signed)
 [facility]    CSN: [id number] Arrival date & time: 02/13/24  1041      History   Chief Complaint Chief Complaint  Patient presents with   Generalized Body Aches    HPI Sandra Sanders is a 43 y.o. female.   HPI  43 year old female with past medical history significant for PFO, cervical cancer, asthma, anxiety, anemia, GERD, history of gastric bypass, history of H. pylori infection, and who is ANA positive presents for evaluation of fever up to 102, body aches, chills, sweats, cough, shortness with, and wheezing that started yesterday.  The patient also is diffusely erythematous and states that her skin feels warm.  No pain or itching.  The patient was evaluated via telemedicine appointment 6 days ago and was prescribed Augmentin  for treatment of a sinus infection.  She reports that she has taken Augmentin  in the past and has tolerated it well in the past without reaction.  Past Medical History:  Diagnosis Date   Anemia    Anxiety    Arthritis    Asthma    Cancer (HCC)    Cervical Cancer   COVID-19 09/2019   GERD (gastroesophageal reflux disease)    Headache    PFO (patent foramen ovale)    as an infant   Thyroid disease     Patient Active Problem List   Diagnosis Date Noted   Chalazion of left lower eyelid 02/10/2023   Viral illness 02/10/2023   Otalgia of both ears 02/10/2023   Hx MRSA infection 01/08/2022   Pain in joint of right ankle 07/09/2021   Contusion of right ankle 07/09/2021   Muscle strain of lower extremity 06/27/2021   Migraine 06/27/2021   Non-seasonal allergic rhinitis 01/22/2021   Epigastric pain 12/27/2020   B12 deficiency 09/23/2018   Degenerative disc disease at L5-S1 level 12/30/2017   Gastroesophageal reflux disease 05/26/2017   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   Hematochezia 11/19/2016   Morbid obesity (HCC) 04/22/2016   Iron deficiency 03/18/2016   Helicobacter pylori  gastrointestinal tract infection 01/18/2016   Vitamin D deficiency 01/18/2016   Fatigue 12/18/2015   Malaise 12/18/2015   Snoring 12/18/2015   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   Thyroid disease 02/23/2014   Syncope 12/09/2013   Insomnia 09/19/2013   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   Left knee pain 06/30/2013   ANA positive 03/08/2013   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   Edema 10/16/2011   Anxiety 09/17/2011   Asthma, well controlled 06/25/2011    Past Surgical History:  Procedure Laterality Date   ABDOMINAL HYSTERECTOMY     AUGMENTATION MAMMAPLASTY Bilateral 2000   CHOLECYSTECTOMY     COLONOSCOPY N/A 01/06/2024   Procedure: COLONOSCOPY;  Surgeon: Conrado Delay, DO;  Location: [facility];  Service: General;  Laterality: N/A;   KNEE SURGERY Left    LAPAROSCOPIC GASTRIC SLEEVE RESECTION N/A 04/22/2016   Procedure: LAPAROSCOPIC GASTRIC SLEEVE RESECTION;  Surgeon: Baldomero Levans, MD;  Location: [facility];  Service: General;  Laterality: N/A;   SEPTOPLASTY      OB History   No obstetric history on file.      Home Medications    Prior to Admission medications   Medication Sig Start Date End Date Taking? Authorizing Provider  acetaminophen  (TYLENOL ) 500 MG tablet Take 1,000 mg by mouth every 6 (six) hours as needed.   Yes [provider]  albuterol  (VENTOLIN  HFA) 108 (  90 Base) MCG/ACT inhaler Inhale 2 puffs into the lungs every 4 (four) hours as needed. 07/15/23  Yes Kent Pear, NP  ALPRAZolam (XANAX) 0.5 MG tablet Take 0.5-1 mg by mouth 3 (three) times daily as needed. 06/03/21  Yes [provider]  amphetamine-dextroamphetamine (ADDERALL) 20 MG tablet Take 20 mg by mouth 4 (four) times daily. 06/14/21  Yes [provider]  aspirin EC 81 MG tablet Take 81 mg by mouth daily. Swallow whole.   Yes [provider]  benzonatate  (TESSALON ) 100 MG capsule Take 2 capsules (200 mg total) by mouth  every 8 (eight) hours. 02/13/24  Yes Kent Pear, NP  BREYNA 160-4.5 MCG/ACT inhaler Inhale into the lungs. 10/05/23 10/04/24 Yes [provider]  buPROPion (WELLBUTRIN) 100 MG tablet Take by mouth. 05/22/22  Yes [provider]  clobetasol cream (TEMOVATE) 0.05 % 1(ONE) APPLICATION(S) TOPICAL 2(TWO) TIMES A DAY 10/25/21  Yes [provider]  cyanocobalamin (VITAMIN B12) 1000 MCG/ML injection Inject into the skin. 12/10/22 07/14/24 Yes [provider]  cyclobenzaprine  (FLEXERIL ) 5 MG tablet Take 1 tablet (5 mg total) by mouth 3 (three) times daily as needed for muscle spasms. 05/09/22  Yes White, Adrienne R, NP  escitalopram (LEXAPRO) 20 MG tablet Take 20 mg by mouth daily. 04/02/22  Yes [provider]  estradiol (CLIMARA - DOSED IN MG/24 HR) 0.075 mg/24hr patch Place onto the skin. 06/29/23  Yes [provider]  fluticasone (FLONASE) 50 MCG/ACT nasal spray 2 sprays into each nostril daily at 0600. 05/02/21  Yes [provider]  furosemide (LASIX) 20 MG tablet Take 1 tablet by mouth daily. 04/18/22  Yes [provider]  hydroxychloroquine (PLAQUENIL) 200 MG tablet Take by mouth.   Yes [provider]  levofloxacin  (LEVAQUIN ) 500 MG tablet Take 1 tablet (500 mg total) by mouth daily. 02/13/24  Yes Kent Pear, NP  levothyroxine (SYNTHROID) 150 MCG tablet Take by mouth. 04/21/23  Yes [provider]  pantoprazole (PROTONIX) 40 MG tablet Take 40 mg by mouth 2 (two) times daily. 06/21/21  Yes [provider]  potassium chloride  (KLOR-CON  M) 10 MEQ tablet Take by mouth. 07/22/23 07/21/24 Yes [provider]  pregabalin (LYRICA) 75 MG capsule Take 75 mg by mouth 2 (two) times daily. 04/09/22  Yes [provider]  promethazine -dextromethorphan (PROMETHAZINE -DM) 6.25-15 MG/5ML syrup Take 5 mLs by mouth 4 (four) times daily as needed. 02/13/24  Yes Kent Pear, NP  Spacer/Aero-Holding Chambers (AEROCHAMBER  MV) inhaler Use as instructed 07/15/23  Yes Kent Pear, NP  SUMAtriptan  (IMITREX ) 50 MG tablet Take 1-2 tablets (50-100 mg total) by mouth once for 1 dose. May repeat in 2 hours if headache persists or recurs. 10/31/20  Yes [provider]  thyroid (ARMOUR) 60 MG tablet Take 60 mg by mouth 2 (two) times daily.   Yes [provider]  tiZANidine (ZANAFLEX) 4 MG tablet Take 1 tablet by mouth every 6 (six) hours. 09/24/23  Yes [provider]  topiramate (TOPAMAX) 25 MG tablet Take 25 mg by mouth 2 (two) times daily.   Yes [provider]  traZODone (DESYREL) 150 MG tablet Take 150 mg by mouth at bedtime.   Yes [provider]  Vilazodone HCl (VIIBRYD) 10 MG TABS Take 10 mg by mouth daily. 08/25/23  Yes [provider]    Family History Family History  Problem Relation Age of Onset   Hypertension Mother    Hypertension Father    Cancer Other  Social History Social History   Tobacco Use   Smoking status: Former    Current packs/day: 0.00    Types: Cigarettes    Quit date: 03/22/2004    Years since quitting: 19.9   Smokeless tobacco: Never  Vaping Use   Vaping status: Never Used  Substance Use Topics   Alcohol use: No   Drug use: No     Allergies   Hydromorphone, Hydromorphone hcl, Other, Hydrocodone, Tramadol, Hydrocodone-acetaminophen , Banana, Citrullus vulgaris, Daucus carota, Lidocaine , Miconazole, and Tioconazole   Review of Systems Review of Systems  Constitutional:  Positive for chills and fever.  HENT:  Positive for congestion, rhinorrhea and sinus pressure. Negative for ear pain.   Respiratory:  Positive for cough, shortness of breath and wheezing.   Gastrointestinal:  Negative for diarrhea, nausea and vomiting.  Skin:  Positive for color change.     Physical Exam Triage Vital Signs ED Triage Vitals  Encounter Vitals Group     BP      Systolic BP Percentile      Diastolic BP Percentile      Pulse      Resp       Temp      Temp src      SpO2      Weight      Height      Head Circumference      Peak Flow      Pain Score      Pain Loc      Pain Education      Exclude from Growth Chart    No data found.  Updated Vital Signs BP 92/66 (BP Location: Left Arm)   Pulse 91   Temp 98.7 F (37.1 C) (Oral)   Ht 5\' 9"  (1.753 m)   Wt 145 lb (65.8 kg)   SpO2 96%   BMI 21.41 kg/m   Visual Acuity Right Eye Distance:   Left Eye Distance:   Bilateral Distance:    Right Eye Near:   Left Eye Near:    Bilateral Near:     Physical Exam Vitals and nursing note reviewed.  Constitutional:      Appearance: Normal appearance. She is ill-appearing.  HENT:     Head: Normocephalic and atraumatic.     Right Ear: Tympanic membrane, ear canal and external ear normal. There is no impacted cerumen.     Left Ear: Tympanic membrane, ear canal and external ear normal. There is no impacted cerumen.     Nose: Congestion and rhinorrhea present.     Comments: His mucosa is mildly erythematous with mild edema and yellow discharge in both nares.    Mouth/Throat:     Mouth: Mucous membranes are moist.     Pharynx: Oropharynx is clear. No oropharyngeal exudate or posterior oropharyngeal erythema.  Neck:     Comments: Bilateral anterior cervical lymphadenopathy present.  Patient reports tenderness to palpation on the right. Cardiovascular:     Rate and Rhythm: Normal rate and regular rhythm.     Pulses: Normal pulses.     Heart sounds: Normal heart sounds. No murmur heard.    No friction rub. No gallop.  Pulmonary:     Effort: Pulmonary effort is normal.     Breath sounds: Normal breath sounds. No wheezing, rhonchi or rales.  Musculoskeletal:     Cervical back: Normal range of motion and neck supple. Tenderness present.  Lymphadenopathy:     Cervical: Cervical adenopathy present.  Skin:  General: Skin is warm and dry.     Capillary Refill: Capillary refill takes less than 2 seconds.     Findings:  Erythema present.  Neurological:     General: No focal deficit present.     Mental Status: She is alert and oriented to person, place, and time.      UC Treatments / Results  Labs (all labs ordered are listed, but only abnormal results are displayed) Labs Reviewed  CBC WITH DIFFERENTIAL/PLATELET - Abnormal; Notable for the following components:      Result Value   Lymphs Abs 0.4 (*)    All other components within normal limits  COMPREHENSIVE METABOLIC PANEL WITH GFR - Abnormal; Notable for the following components:   Sodium 134 (*)    Glucose, Bld 114 (*)    Calcium 8.6 (*)    Total Protein 6.0 (*)    AST 210 (*)    ALT 117 (*)    All other components within normal limits  RESP PANEL BY RT-PCR (FLU A&B, COVID) [id number]    EKG   Radiology DG Chest 2 View Result Date: 02/13/2024 CLINICAL DATA:  Productive cough and fever. EXAM: CHEST - 2 VIEW COMPARISON:  January 11, 2024. January 08, 2022 FINDINGS: The cardiomediastinal silhouette is unchanged in contour. No pleural effusion. No pneumothorax. Patchy reticulonodular opacities at the LEFT lateral lower lobe. Visualized abdomen is unremarkable. Minimal degenerative changes of the thoracic spine. Status post cervical spine surgery. IMPRESSION: Patchy reticulonodular opacities at the LEFT lateral lower lobe. Findings are favored to reflect infection. Recommend follow-up PA and lateral radiograph in 4-6 weeks to assess for resolution. Electronically Signed   By: Clancy Crimes M.D.   On: 02/13/2024 11:44    Procedures Procedures (including critical care time)  Medications Ordered in UC Medications - No data to display  Initial Impression / Assessment and Plan / UC Course  I have reviewed the triage vital signs and the nursing notes.  Pertinent labs & imaging results that were available during my care of the patient were reviewed by me and considered in my medical decision making (see chart for details).   Patient is a pleasant,  though mildly ill-appearing, 43 year old female presenting for evaluation of skin and respiratory complaints as outlined in HPI above.  She has been on Augmentin  for a sinus infection for 6 days and last night developed a fever up to 102, significant body and joint aches, chills, cough that is intermittently productive, shortness of breath, and wheezing.  Her body is also diffusely erythematous and she reports it feels warm to touch.  No blisters or lesions noted.  She denies GI symptoms.  As you can see in image above, the patient does have erythema on her forearms, and face.  The erythema is blanchable, does not itch and is not painful.  No erythema noted on chest, back, or lower extremities.  No Sandra Sanders, pustules, or vesicles noted.  She is also complaining of significant pain with breathing though her lungs are clear to auscultation in all fields.  Differential diagnosis includes drug reaction, photosensitivity reaction, COVID, influenza, viral illness.  I will order a COVID and flu PCR, CBC and CMP to evaluate for any evidence of systemic infection or electrolyte abnormality.  I will also order chest x-ray to evaluate for any acute cardiopulmonary pathology.  Chest x-ray independently reviewed and evaluated by me.  Impression: Lung fields are well aerated without evidence of infiltrate or effusion.  Cardiomediastinal silhouette appears normal.  Radiology  overread is pending. Radiology impression states patchy reticulonodular opacities in the left lateral lower lobe favored to represent infection.  CBC shows an white count of 8.2.  H&H is normal at 12.8 and 37.5 respectively.  Platelets normal at 182.  No abnormalities to differential.  Respiratory panel is negative for COVID or influenza.  CMP shows mild hyponatremia with a sodium of 134.  Potassium and chloride are both normal.  Glucose is mildly elevated at 114.  Renal function is normal.  Mild hypocalcemia with a calcium of 8.6.  Total protein is  decreased at 6.0 also.  AST is markedly elevated 210 and ALT is also elevated at 117.  Alkaline phos is normal at 123.  I suspect that this transient elevation of ALT and AST is secondary to Augmentin .  Given the suspected pneumonia I will have patient's stop her Augmentin  and I will switch her to Levaquin  500 mg once daily for 7 days.  She may continue to take over-the-counter Tylenol  and or ibuprofen  as needed for fever and pain.  I will also prescribe Tessalon  Perles and Promethazine  DM cough syrup that she can use for cough and congestion.  She should make a follow-up appointment with her PCP when she finishes the antibiotics for repeat LFTs to ensure normalization of her transaminases.  She also needs a repeat chest x-ray in 4 to 6 weeks to ensure resolution of pneumonia.   Final Clinical Impressions(s) / UC Diagnoses   Final diagnoses:  Acute cough  Pneumonia of left lower lobe due to infectious organism     Discharge Instructions      Your chest x-ray showed a possible pneumonia in the lateral aspect of your left lower lobe.  Your blood work was reassuring and did not show any evidence of systemic infection though you do have elevation of several of your liver enzymes.  This is most likely due to the infection and also the Augmentin  you were taking.  Stop taking the Augmentin .  Start the Levaquin  500 mg once daily for 7 days for treatment of your pneumonia.  You may use over-the-counter Tylenol  and/or ibuprofen  according the pack instructions as needed for fever and pain.  Use the Tessalon  Perles every 8 hours or the day as needed for cough.  Take them with a small sip of water.  They may give you numbness to the base of your tongue, or metallic taste in your mouth, this is normal.  Use the Promethazine  DM cough syrup every 6 hours as needed for cough and congestion.  You may want to save this medication for bedtime as it does tend to make people sleepy.  You need to make a  follow-up appointment with your primary care provider to have your liver function checked after you finish antibiotics to ensure that it returns to normal.  You will also need to repeat chest x-ray in 4 to 6 weeks to ensure resolution of your pneumonia.  If you develop any new or worsening symptoms other return for reevaluation, follow-up with your primary care provider, or seek care in the emergency department.   ED Prescriptions     Medication Sig Dispense Auth. Provider   benzonatate  (TESSALON ) 100 MG capsule Take 2 capsules (200 mg total) by mouth every 8 (eight) hours. 21 capsule Kent Pear, NP   promethazine -dextromethorphan (PROMETHAZINE -DM) 6.25-15 MG/5ML syrup Take 5 mLs by mouth 4 (four) times daily as needed. 118 mL Kent Pear, NP   levofloxacin  (LEVAQUIN ) 500 MG tablet Take 1 tablet (  500 mg total) by mouth daily. 7 tablet Kent Pear, NP      PDMP not reviewed this encounter.   Kent Pear, NP 02/13/24 1205

## 2024-02-13 NOTE — ED Triage Notes (Signed)
 Pt cis with her mother   Pt c/o body aches, temp of 102, body chills, sweating x1day  Pt states that on Sunday she had sinus pain and pressure and was given medication from a [facility] - Amoxicillin  875-125mg   Pt has taken 800mg  ibuprofen  at 8:30am  Pt states that she has wheezing and it hurts to breath.  Pt has a hx of asthma

## 2024-02-13 NOTE — Discharge Instructions (Addendum)
 Your chest x-ray showed a possible pneumonia in the lateral aspect of your left lower lobe.  Your blood work was reassuring and did not show any evidence of systemic infection though you do have elevation of several of your liver enzymes.  This is most likely due to the infection and also the Augmentin  you were taking.  Stop taking the Augmentin .  Start the Levaquin  500 mg once daily for 7 days for treatment of your pneumonia.  You may use over-the-counter Tylenol  and/or ibuprofen  according the pack instructions as needed for fever and pain.  Use the Tessalon  Perles every 8 hours or the day as needed for cough.  Take them with a small sip of water.  They may give you numbness to the base of your tongue, or metallic taste in your mouth, this is normal.  Use the Promethazine  DM cough syrup every 6 hours as needed for cough and congestion.  You may want to save this medication for bedtime as it does tend to make people sleepy.  You need to make a follow-up appointment with your primary care provider to have your liver function checked after you finish antibiotics to ensure that it returns to normal.  You will also need to repeat chest x-ray in 4 to 6 weeks to ensure resolution of your pneumonia.  If you develop any new or worsening symptoms other return for reevaluation, follow-up with your primary care provider, or seek care in the emergency department.

## 2024-03-22 ENCOUNTER — Ambulatory Visit
Admission: EM | Admit: 2024-03-22 | Discharge: 2024-03-22 | Disposition: A | Discharge status: Home / Self Care | Attending: Physician Assistant | Admitting: Physician Assistant

## 2024-03-22 DIAGNOSIS — R110 Nausea: Secondary | ICD-10-CM | POA: Diagnosis not present

## 2024-03-22 DIAGNOSIS — G43811 Other migraine, intractable, with status migrainosus: Secondary | ICD-10-CM | POA: Diagnosis not present

## 2024-03-22 MED ORDER — DEXAMETHASONE SODIUM PHOSPHATE 10 MG/ML IJ SOLN
10.0000 mg | Freq: Once | INTRAMUSCULAR | Status: AC
  Administered 2024-03-22: 10 mg via INTRAMUSCULAR

## 2024-03-22 MED ORDER — METOCLOPRAMIDE HCL 5 MG/ML IJ SOLN: 10.0000 mg | Freq: Once | INTRAMUSCULAR | Status: DC

## 2024-03-22 MED ORDER — METOCLOPRAMIDE HCL 5 MG/ML IJ SOLN
10.0000 mg | Freq: Once | INTRAMUSCULAR | Status: AC
  Administered 2024-03-22: 10 mg via INTRAMUSCULAR

## 2024-03-22 MED ORDER — KETOROLAC TROMETHAMINE 60 MG/2ML IM SOLN
60.0000 mg | Freq: Once | INTRAMUSCULAR | Status: AC
  Administered 2024-03-22: 60 mg via INTRAMUSCULAR

## 2024-03-22 NOTE — ED Triage Notes (Signed)
 Tylenol  IBU Aleve  Imitrex   Migraine x 5 days

## 2024-03-22 NOTE — ED Provider Notes (Signed)
 [facility]    CSN: [id number] Arrival date & time: 03/22/24  1839      History   Chief Complaint Chief Complaint  Patient presents with   Migraine    HPI Sandra Sanders is a 43 y.o. female presenting for 5-day history of migraine headache, nausea without vomiting, photophobia, phonophobia.  Patient has a history of migraines.  She has tried Imitrex  multiple times without relief.  Took ibuprofen  earlier this morning and it did not help.  Tried Aleve  yesterday.  Took Tylenol  couple hours ago without relief.  Has taken a couple doses of Zofran  today.  Patient says when her migraines get this bad she usually receives injections.  She is unsure the names of the injections.  She says she has had migraines this bad in the past which have lasted for several days.  Patient denies any confusion, numbness/tingling or weakness, difficulty walking or speaking.  No vision changes or loss of vision.  Denies head injuries.  Patient has had migraines this bad before.  This is not the worst headache she has ever had.  Patient's partner has brought her into the urgent care this evening.  HPI  Past Medical History:  Diagnosis Date   Anemia    Anxiety    Arthritis    Asthma    Cancer (HCC)    Cervical Cancer   COVID-19 09/2019   GERD (gastroesophageal reflux disease)    Headache    PFO (patent foramen ovale)    as an infant   Thyroid disease     Patient Active Problem List   Diagnosis Date Noted   Chalazion of left lower eyelid 02/10/2023   Viral illness 02/10/2023   Otalgia of both ears 02/10/2023   Hx MRSA infection 01/08/2022   Pain in joint of right ankle 07/09/2021   Contusion of right ankle 07/09/2021   Muscle strain of lower extremity 06/27/2021   Migraine 06/27/2021   Non-seasonal allergic rhinitis 01/22/2021   Epigastric pain 12/27/2020   B12 deficiency 09/23/2018   Degenerative disc disease at L5-S1 level 12/30/2017   Gastroesophageal reflux disease 05/26/2017    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   Hematochezia 11/19/2016   Morbid obesity (HCC) 04/22/2016   Iron deficiency 03/18/2016   Helicobacter pylori gastrointestinal tract infection 01/18/2016   Vitamin D deficiency 01/18/2016   Fatigue 12/18/2015   Malaise 12/18/2015   Snoring 12/18/2015   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   Thyroid disease 02/23/2014   Syncope 12/09/2013   Insomnia 09/19/2013   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   Left knee pain 06/30/2013   ANA positive 03/08/2013   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   Edema 10/16/2011   Anxiety 09/17/2011   Asthma, well controlled 06/25/2011    Past Surgical History:  Procedure Laterality Date   ABDOMINAL HYSTERECTOMY     AUGMENTATION MAMMAPLASTY Bilateral 2000   CHOLECYSTECTOMY     COLONOSCOPY N/A 01/06/2024   Procedure: COLONOSCOPY;  Surgeon: Tye Millet, DO;  Location: [facility];  Service: General;  Laterality: N/A;   KNEE SURGERY Left    LAPAROSCOPIC GASTRIC SLEEVE RESECTION N/A 04/22/2016   Procedure: LAPAROSCOPIC GASTRIC SLEEVE RESECTION;  Surgeon: Thom CHRISTELLA Pin, MD;  Location: [facility];  Service: General;  Laterality: N/A;   SEPTOPLASTY      OB History   No obstetric history on file.      Home Medications    Prior to  Admission medications   Medication Sig Start Date End Date Taking? Authorizing Provider  amphetamine-dextroamphetamine (ADDERALL) 20 MG tablet Take 20 mg by mouth 4 (four) times daily. 06/14/21  Yes [provider]  aspirin EC 81 MG tablet Take 81 mg by mouth daily. Swallow whole.   Yes [provider]  buPROPion (WELLBUTRIN) 100 MG tablet Take by mouth. 05/22/22  Yes [provider]  cyanocobalamin (VITAMIN B12) 1000 MCG/ML injection Inject into the skin. 12/10/22 07/14/24 Yes [provider]  escitalopram (LEXAPRO) 20 MG tablet Take 20 mg by mouth daily. 04/02/22  Yes  [provider]  furosemide (LASIX) 20 MG tablet Take 1 tablet by mouth daily. 04/18/22  Yes [provider]  hydroxychloroquine (PLAQUENIL) 200 MG tablet Take by mouth.   Yes [provider]  levothyroxine (SYNTHROID) 150 MCG tablet Take by mouth. 04/21/23  Yes [provider]  pantoprazole (PROTONIX) 40 MG tablet Take 40 mg by mouth 2 (two) times daily. 06/21/21  Yes [provider]  potassium chloride  (KLOR-CON  M) 10 MEQ tablet Take by mouth. 07/22/23 07/21/24 Yes [provider]  pregabalin (LYRICA) 75 MG capsule Take 75 mg by mouth 2 (two) times daily. 04/09/22  Yes [provider]  SUMAtriptan  (IMITREX ) 50 MG tablet Take 1-2 tablets (50-100 mg total) by mouth once for 1 dose. May repeat in 2 hours if headache persists or recurs. 10/31/20  Yes [provider]  topiramate (TOPAMAX) 25 MG tablet Take 25 mg by mouth 2 (two) times daily.   Yes [provider]  traZODone (DESYREL) 150 MG tablet Take 150 mg by mouth at bedtime.   Yes [provider]  Vilazodone HCl (VIIBRYD) 10 MG TABS Take 10 mg by mouth daily. 08/25/23  Yes [provider]  acetaminophen  (TYLENOL ) 500 MG tablet Take 1,000 mg by mouth every 6 (six) hours as needed.    [provider]  albuterol  (VENTOLIN  HFA) 108 (90 Base) MCG/ACT inhaler Inhale 2 puffs into the lungs every 4 (four) hours as needed. 07/15/23   Bernardino Ditch, NP  ALPRAZolam (XANAX) 0.5 MG tablet Take 0.5-1 mg by mouth 3 (three) times daily as needed. 06/03/21   [provider]  benzonatate  (TESSALON ) 100 MG capsule Take 2 capsules (200 mg total) by mouth every 8 (eight) hours. 02/13/24   Bernardino Ditch, NP  BREYNA 160-4.5 MCG/ACT inhaler Inhale into the lungs. 10/05/23 10/04/24  [provider]  clobetasol cream (TEMOVATE) 0.05 % 1(ONE) APPLICATION(S) TOPICAL 2(TWO) TIMES A DAY 10/25/21   [provider]  cyclobenzaprine  (FLEXERIL ) 5 MG tablet Take 1  tablet (5 mg total) by mouth 3 (three) times daily as needed for muscle spasms. 05/09/22   White, Shelba SAUNDERS, NP  estradiol (CLIMARA - DOSED IN MG/24 HR) 0.075 mg/24hr patch Place onto the skin. 06/29/23   [provider]  fluticasone (FLONASE) 50 MCG/ACT nasal spray 2 sprays into each nostril daily at 0600. 05/02/21   [provider]  levofloxacin  (LEVAQUIN ) 500 MG tablet Take 1 tablet (500 mg total) by mouth daily. 02/13/24   Bernardino Ditch, NP  promethazine -dextromethorphan (PROMETHAZINE -DM) 6.25-15 MG/5ML syrup Take 5 mLs by mouth 4 (four) times daily as needed. 02/13/24   Bernardino Ditch, NP  Spacer/Aero-Holding Chambers (AEROCHAMBER MV) inhaler Use as instructed 07/15/23   Bernardino Ditch, NP  thyroid (ARMOUR) 60 MG tablet Take 60 mg by mouth 2 (two) times daily.    [provider]  tiZANidine (ZANAFLEX) 4 MG tablet Take 1 tablet by mouth  every 6 (six) hours. 09/24/23   [provider]    Family History Family History  Problem Relation Age of Onset   Hypertension Mother    Hypertension Father    Cancer Other     Social History Social History   Tobacco Use   Smoking status: Former    Current packs/day: 0.00    Types: Cigarettes    Quit date: 03/22/2004    Years since quitting: 20.0   Smokeless tobacco: Never  Vaping Use   Vaping status: Never Used  Substance Use Topics   Alcohol use: No   Drug use: No     Allergies   Dextrose , Hydromorphone, Hydromorphone hcl, Other, Hydrocodone, Tramadol, Banana extract allergy skin test, Hydrocodone-acetaminophen , Banana, Citrullus vulgaris, Daucus carota, Lidocaine , Miconazole, and Tioconazole   Review of Systems Review of Systems  Constitutional:  Positive for fatigue. Negative for fever.  HENT:  Negative for congestion.   Eyes:  Positive for photophobia. Negative for visual disturbance.  Respiratory:  Negative for cough and shortness of breath.   Cardiovascular:  Negative for chest pain and palpitations.   Gastrointestinal:  Positive for nausea. Negative for vomiting.  Musculoskeletal:  Negative for gait problem.  Neurological:  Positive for light-headedness and headaches. Negative for dizziness, tremors, seizures, syncope, facial asymmetry, speech difficulty, weakness and numbness.  Psychiatric/Behavioral:  Negative for confusion.      Physical Exam Triage Vital Signs ED Triage Vitals  Encounter Vitals Group     BP 03/22/24 1909 100/68     Girls Systolic BP Percentile --      Girls Diastolic BP Percentile --      Boys Systolic BP Percentile --      Boys Diastolic BP Percentile --      Pulse Rate 03/22/24 1909 (!) 56     Resp 03/22/24 1909 17     Temp 03/22/24 1909 (!) 97 F (36.1 C)     Temp Source 03/22/24 1909 Oral     SpO2 03/22/24 1909 100 %     Weight --      Height --      Head Circumference --      Peak Flow --      Pain Score 03/22/24 1907 10     Pain Loc --      Pain Education --      Exclude from Growth Chart --    No data found.  Updated Vital Signs BP 100/68 (BP Location: Left Arm)   Pulse (!) 56   Temp (!) 97 F (36.1 C) (Oral)   Resp 17   SpO2 100%      Physical Exam Vitals and nursing note reviewed.  Constitutional:      General: She is not in acute distress.    Appearance: Normal appearance. She is ill-appearing (appears fatigued and in pain). She is not toxic-appearing.  HENT:     Head: Normocephalic and atraumatic.     Right Ear: Tympanic membrane, ear canal and external ear normal.     Left Ear: Ear canal and external ear normal. A middle ear effusion is present.     Nose: Nose normal.     Mouth/Throat:     Mouth: Mucous membranes are moist.     Pharynx: Oropharynx is clear.   Eyes:     General: No scleral icterus.       Right eye: No discharge.        Left eye: No discharge.     Conjunctiva/sclera:  Conjunctivae normal.    Cardiovascular:     Rate and Rhythm: Regular rhythm. Bradycardia present.     Heart sounds: Normal heart  sounds.  Pulmonary:     Effort: Pulmonary effort is normal. No respiratory distress.     Breath sounds: Normal breath sounds.   Musculoskeletal:     Cervical back: Neck supple.   Skin:    General: Skin is dry.   Neurological:     General: No focal deficit present.     Mental Status: She is alert and oriented to person, place, and time. Mental status is at baseline.     Cranial Nerves: No cranial nerve deficit.     Motor: No weakness.     Coordination: Coordination normal.     Gait: Gait normal.     Comments: Normal nose to finger testing bilaterally.  Normal visual fields.  5/ 5 strength bilateral upper and lower extremities.     UC Treatments / Results  Labs (all labs ordered are listed, but only abnormal results are displayed) Labs Reviewed - No data to display  EKG   Radiology No results found.  Procedures Procedures (including critical care time)  Medications Ordered in UC Medications  dexamethasone  (DECADRON ) injection 10 mg (10 mg Intramuscular Given 03/22/24 1933)  ketorolac  (TORADOL ) injection 60 mg (60 mg Intramuscular Given 03/22/24 1933)  metoCLOPramide (REGLAN) injection 10 mg (10 mg Intramuscular Given 03/22/24 1934)    Initial Impression / Assessment and Plan / UC Course  I have reviewed the triage vital signs and the nursing notes.  Pertinent labs & imaging results that were available during my care of the patient were reviewed by me and considered in my medical decision making (see chart for details).   43 year old female with history of migraines presents for 5-day history of migraine headache, lightheadedness, nausea without vomiting, photophobia and phonophobia.  Patient has tried ibuprofen , Aleve , Tylenol , Zofran  and Imitrex  without relief.  Has had migraines as bad in the past and has improved with injections.  Currently afebrile.  Ill-appearing and uncomfortable.  Appears to be in some level of pain.  Lights are turned down in exam room.  Normal  cranial nerve exam.  5 out of 5 strength bilateral upper and lower extremities.  Normal is to finger testing and normal gait.  Chest clear to auscultation.  Heart regular rate and rhythm.  Advised patient there are only a few medications we had in urgent care to help with migraines.  Patient would like to try Toradol , dexamethasone  and Reglan.  All injections administered by nursing staff.  Patient advised to: Rest increase fluids.  Advised if not improving by tomorrow or worsening symptoms she needs to go to the ER for management.  Explained migraines become a lot more difficult to treat after 3 days.  Also discussed going to ED for any red flag signs or symptoms such as worsening of headache, no improvement, numbness/tingling or weakness, loss of vision, passing out or feeling faint, chest pain, shortness of breath, palpitations, etc.   Final Clinical Impressions(s) / UC Diagnoses   Final diagnoses:  Other migraine with status migrainosus, intractable  Nausea without vomiting     Discharge Instructions      - You were given 10 mg IM dexamethasone , 60 mg IM ketorolac  and 10 mg IM Reglan in the clinic to help with your migraine.  If you not feeling better tomorrow you should go to the emergency department for treatment of your migraine.  As discussed  they become a lot more difficult to treat after 3 days. - Rest and increase fluid intake. - Avoid triggers. - If worsening of headache, no improvement tomorrow, numbness/tingling, extremity weakness, passing out, chest pain, shortness of breath, dizziness, loss of vision, call 911 or go to ER.     ED Prescriptions   None    PDMP not reviewed this encounter.   Arvis Jolan NOVAK, PA-C 03/22/24 1956

## 2024-03-22 NOTE — Discharge Instructions (Addendum)
-   You were given 10 mg IM dexamethasone , 60 mg IM ketorolac  and 10 mg IM Reglan in the clinic to help with your migraine.  If you not feeling better tomorrow you should go to the emergency department for treatment of your migraine.  As discussed they become a lot more difficult to treat after 3 days. - Rest and increase fluid intake. - Avoid triggers. - If worsening of headache, no improvement tomorrow, numbness/tingling, extremity weakness, passing out, chest pain, shortness of breath, dizziness, loss of vision, call 911 or go to ER.

## 2024-03-31 ENCOUNTER — Ambulatory Visit: Admission: EM | Admit: 2024-03-31 | Discharge: 2024-03-31 | Disposition: A | Discharge status: Home / Self Care

## 2024-03-31 DIAGNOSIS — G43001 Migraine without aura, not intractable, with status migrainosus: Secondary | ICD-10-CM

## 2024-03-31 DIAGNOSIS — R110 Nausea: Secondary | ICD-10-CM | POA: Diagnosis not present

## 2024-03-31 MED ORDER — DEXAMETHASONE SODIUM PHOSPHATE 10 MG/ML IJ SOLN
10.0000 mg | Freq: Once | INTRAMUSCULAR | Status: AC
  Administered 2024-03-31: 10 mg via INTRAVENOUS

## 2024-03-31 MED ORDER — METOCLOPRAMIDE HCL 5 MG/ML IJ SOLN
10.0000 mg | Freq: Once | INTRAMUSCULAR | Status: AC
  Administered 2024-03-31: 10 mg via INTRAVENOUS

## 2024-03-31 MED ORDER — KETOROLAC TROMETHAMINE 30 MG/ML IJ SOLN
30.0000 mg | Freq: Once | INTRAMUSCULAR | Status: AC
  Administered 2024-03-31: 30 mg via INTRAVENOUS

## 2024-03-31 MED ORDER — SODIUM CHLORIDE 0.9 % IV BOLUS
250.0000 mL | Freq: Once | INTRAVENOUS | Status: AC
  Administered 2024-03-31: 250 mL via INTRAVENOUS

## 2024-03-31 NOTE — Discharge Instructions (Signed)
  1. Migraine without aura and with status migrainosus, not intractable (Primary) - ketorolac  (TORADOL ) 30 MG/ML IV injection 30 mg given in UC for migraine headache - dexamethasone  (DECADRON ) IV injection 10 mg given in UC for migraine headache - sodium chloride  0.9 % IV bolus 250 mL given in UC for migraine headache  2. Nausea - metoCLOPramide  (REGLAN ) IV injection 10 mg given in UC for acute nausea - Insert peripheral IV for IV medications and normal saline bolus. -Continue to monitor symptoms for any change in severity if there is any escalation of current symptoms or development of new symptoms follow-up in ER for further evaluation and management.

## 2024-03-31 NOTE — ED Provider Notes (Signed)
 [facility]  Note:  This document was prepared using Conservation officer, historic buildings and may include unintentional dictation errors.  MRN: [id number] DOB: 08/27/1981  Subjective:   Sandra Sanders is a 43 y.o. female presenting for migraine headache since Sunday.  Patient reports that she was seen here in urgent care was given multiple injections for migraine headache which seemed to subside at the time but then quickly returned.  Patient has been using Goody powder and previously prescribed migraine control medications with no improvement.  Patient is referred to follow-up with neurology and is waiting on an appointment.  Patient denies any vomiting but states that she is extremely nauseous.  Patient states that headache comes in waves and feels throbbing which is typical for her migraine headaches.  No current facility-administered medications for this encounter.  Current Outpatient Medications:    acetaminophen  (TYLENOL ) 500 MG tablet, Take 1,000 mg by mouth every 6 (six) hours as needed., Disp: , Rfl:    albuterol  (VENTOLIN  HFA) 108 (90 Base) MCG/ACT inhaler, Inhale 2 puffs into the lungs every 4 (four) hours as needed., Disp: 18 g, Rfl: 0   ALPRAZolam (XANAX) 0.5 MG tablet, Take 0.5-1 mg by mouth 3 (three) times daily as needed., Disp: , Rfl:    amphetamine-dextroamphetamine (ADDERALL) 20 MG tablet, Take 20 mg by mouth 4 (four) times daily., Disp: , Rfl:    aspirin EC 81 MG tablet, Take 81 mg by mouth daily. Swallow whole., Disp: , Rfl:    benzonatate  (TESSALON ) 100 MG capsule, Take 2 capsules (200 mg total) by mouth every 8 (eight) hours., Disp: 21 capsule, Rfl: 0   BREYNA 160-4.5 MCG/ACT inhaler, Inhale into the lungs., Disp: , Rfl:    buPROPion (WELLBUTRIN) 100 MG tablet, Take by mouth., Disp: , Rfl:    clobetasol cream (TEMOVATE) 0.05 %, 1(ONE) APPLICATION(S) TOPICAL 2(TWO) TIMES A DAY, Disp: , Rfl:    cyanocobalamin (VITAMIN B12) 1000 MCG/ML injection, Inject into the  skin., Disp: , Rfl:    cyclobenzaprine  (FLEXERIL ) 5 MG tablet, Take 1 tablet (5 mg total) by mouth 3 (three) times daily as needed for muscle spasms., Disp: 30 tablet, Rfl: 0   escitalopram (LEXAPRO) 20 MG tablet, Take 20 mg by mouth daily., Disp: , Rfl:    estradiol (CLIMARA - DOSED IN MG/24 HR) 0.075 mg/24hr patch, Place onto the skin., Disp: , Rfl:    fluticasone (FLONASE) 50 MCG/ACT nasal spray, 2 sprays into each nostril daily at 0600., Disp: , Rfl:    furosemide (LASIX) 20 MG tablet, Take 1 tablet by mouth daily., Disp: , Rfl:    hydroxychloroquine (PLAQUENIL) 200 MG tablet, Take by mouth., Disp: , Rfl:    levofloxacin  (LEVAQUIN ) 500 MG tablet, Take 1 tablet (500 mg total) by mouth daily., Disp: 7 tablet, Rfl: 0   levothyroxine (SYNTHROID) 150 MCG tablet, Take by mouth., Disp: , Rfl:    pantoprazole (PROTONIX) 40 MG tablet, Take 40 mg by mouth 2 (two) times daily., Disp: , Rfl:    potassium chloride  (KLOR-CON  M) 10 MEQ tablet, Take by mouth., Disp: , Rfl:    pregabalin (LYRICA) 75 MG capsule, Take 75 mg by mouth 2 (two) times daily., Disp: , Rfl:    promethazine -dextromethorphan (PROMETHAZINE -DM) 6.25-15 MG/5ML syrup, Take 5 mLs by mouth 4 (four) times daily as needed., Disp: 118 mL, Rfl: 0   Spacer/Aero-Holding Chambers (AEROCHAMBER MV) inhaler, Use as instructed, Disp: 1 each, Rfl: 2   SUMAtriptan  (IMITREX ) 50 MG tablet, Take 1-2 tablets (50-100  mg total) by mouth once for 1 dose. May repeat in 2 hours if headache persists or recurs., Disp: , Rfl:    thyroid (ARMOUR) 60 MG tablet, Take 60 mg by mouth 2 (two) times daily., Disp: , Rfl:    tiZANidine (ZANAFLEX) 4 MG tablet, Take 1 tablet by mouth every 6 (six) hours., Disp: , Rfl:    topiramate (TOPAMAX) 25 MG tablet, Take 25 mg by mouth 2 (two) times daily., Disp: , Rfl:    traZODone (DESYREL) 150 MG tablet, Take 150 mg by mouth at bedtime., Disp: , Rfl:    Vilazodone HCl (VIIBRYD) 10 MG TABS, Take 10 mg by mouth daily., Disp: , Rfl:     Allergies  Allergen Reactions   Dextrose  Other (See Comments)    Added for 72 hour fast 11/19/23-11/22/23 > please delete after study   Hydromorphone Hives and Itching    Noted post op after receiving intra-op Noted post op after receiving intra-op  Noted post op after receiving intra-op   Noted post op after receiving intra-op    Hydromorphone Hcl Hives and Itching   Other Anaphylaxis    Carrots, bananas, and watermelon. per pt Other reaction(s): Other (See Comments) Uncoded Allergy. Allergen: Strawberries, Other Reaction: NOT ASSESSED   Hydrocodone Itching   Tramadol Itching   Banana Extract Allergy Skin Test Itching    banana extract   Hydrocodone-Acetaminophen  Hives    Really bad itching all over   Banana Itching   Citrullus Vulgaris Itching   Daucus Carota Itching   Lidocaine      PT REPORTS   DID NOT WORK     Miconazole Other (See Comments)    SKIN BLISTERS   Tioconazole Rash    Past Medical History:  Diagnosis Date   Anemia    Anxiety    Arthritis    Asthma    Cancer (HCC)    Cervical Cancer   COVID-19 09/2019   GERD (gastroesophageal reflux disease)    Headache    PFO (patent foramen ovale)    as an infant   Thyroid disease      Past Surgical History:  Procedure Laterality Date   ABDOMINAL HYSTERECTOMY     AUGMENTATION MAMMAPLASTY Bilateral 2000   CHOLECYSTECTOMY     COLONOSCOPY N/A 01/06/2024   Procedure: COLONOSCOPY;  Surgeon: Tye Millet, DO;  Location: [facility];  Service: General;  Laterality: N/A;   KNEE SURGERY Left    LAPAROSCOPIC GASTRIC SLEEVE RESECTION N/A 04/22/2016   Procedure: LAPAROSCOPIC GASTRIC SLEEVE RESECTION;  Surgeon: Thom CHRISTELLA Pin, MD;  Location: [facility] OR[facility];  Service: General;  Laterality: N/A;   SEPTOPLASTY      Family History  Problem Relation Age of Onset   Hypertension Mother    Hypertension Father    Cancer Other     Social History   Tobacco Use   Smoking status: Former    Current packs/day: 0.00     Types: Cigarettes    Quit date: 03/22/2004    Years since quitting: 20.0   Smokeless tobacco: Never  Vaping Use   Vaping status: Never Used  Substance Use Topics   Alcohol use: No   Drug use: No    ROS Refer to HPI for ROS details.  Objective:   Vitals: BP 100/68 (BP Location: Left Arm)   Pulse 69   Temp 97.6 F (36.4 C) (Oral)   Ht 5' 9 (1.753 m)   Wt 145 lb 1 oz (65.8 kg)   SpO2 96%  BMI 21.42 kg/m   Physical Exam Vitals and nursing note reviewed.  Constitutional:      General: She is not in acute distress.    Appearance: Normal appearance. She is well-developed. She is not ill-appearing or toxic-appearing.  HENT:     Head: Normocephalic and atraumatic.  Eyes:     General:        Right eye: No discharge.        Left eye: No discharge.     Extraocular Movements: Extraocular movements intact.     Conjunctiva/sclera: Conjunctivae normal.     Pupils: Pupils are equal, round, and reactive to light.  Cardiovascular:     Rate and Rhythm: Normal rate.  Pulmonary:     Effort: Pulmonary effort is normal. No respiratory distress.  Skin:    General: Skin is warm and dry.  Neurological:     General: No focal deficit present.     Mental Status: She is alert and oriented to person, place, and time. Mental status is at baseline.     Cranial Nerves: No cranial nerve deficit.     Sensory: No sensory deficit.     Motor: No weakness.  Psychiatric:        Mood and Affect: Mood normal.        Behavior: Behavior normal.     Procedures  No results found for this or any previous visit (from the past 24 hours).  Assessment and Plan :     Discharge Instructions       1. Migraine without aura and with status migrainosus, not intractable (Primary) - ketorolac  (TORADOL ) 30 MG/ML IV injection 30 mg given in UC for migraine headache - dexamethasone  (DECADRON ) IV injection 10 mg given in UC for migraine headache - sodium chloride  0.9 % IV bolus 250 mL given in UC for  migraine headache  2. Nausea - metoCLOPramide  (REGLAN ) IV injection 10 mg given in UC for acute nausea - Insert peripheral IV for IV medications and normal saline bolus. -Continue to monitor symptoms for any change in severity if there is any escalation of current symptoms or development of new symptoms follow-up in ER for further evaluation and management.      Lanitra Battaglini B Cindee Mclester   Teyon Odette, [place], [place] 03/31/24 1253

## 2024-03-31 NOTE — ED Triage Notes (Signed)
 Pt is with her son  Pt c/o Migraine from last visit  Pt has had taken [facility] and Goodie powder  Pt has a referral for Neurology and is waiting for an appointment.   Pt states the medication given on 03/22/24 did help her headache.

## 2024-04-06 ENCOUNTER — Ambulatory Visit
Admission: EM | Admit: 2024-04-06 | Discharge: 2024-04-06 | Disposition: A | Discharge status: Home / Self Care | Attending: Family Medicine | Admitting: Family Medicine

## 2024-04-06 ENCOUNTER — Ambulatory Visit (INDEPENDENT_AMBULATORY_CARE_PROVIDER_SITE_OTHER)

## 2024-04-06 ENCOUNTER — Encounter: Payer: Self-pay | Admitting: Emergency Medicine

## 2024-04-06 DIAGNOSIS — R0602 Shortness of breath: Secondary | ICD-10-CM | POA: Diagnosis present

## 2024-04-06 DIAGNOSIS — H6993 Unspecified Eustachian tube disorder, bilateral: Secondary | ICD-10-CM | POA: Diagnosis present

## 2024-04-06 DIAGNOSIS — J111 Influenza due to unidentified influenza virus with other respiratory manifestations: Secondary | ICD-10-CM | POA: Diagnosis present

## 2024-04-06 LAB — URINALYSIS, W/ REFLEX TO CULTURE (INFECTION SUSPECTED)
Bilirubin Urine: NEGATIVE
Glucose, UA: NEGATIVE mg/dL
Hgb urine dipstick: NEGATIVE
Leukocytes,Ua: NEGATIVE
Nitrite: NEGATIVE
Protein, ur: NEGATIVE mg/dL
RBC / HPF: NONE SEEN RBC/hpf (ref 0–5)
Specific Gravity, Urine: 1.01 (ref 1.005–1.030)
WBC, UA: NONE SEEN WBC/hpf (ref 0–5)
pH: 6 (ref 5.0–8.0)

## 2024-04-06 LAB — RESP PANEL BY RT-PCR (RSV, FLU A&B, COVID)  RVPGX2
Influenza A by PCR: NEGATIVE
Influenza B by PCR: NEGATIVE
Resp Syncytial Virus by PCR: NEGATIVE
SARS Coronavirus 2 by RT PCR: NEGATIVE

## 2024-04-06 MED ORDER — HYDROCOD POLI-CHLORPHE POLI ER 10-8 MG/5ML PO SUER: 5.0000 mL | Freq: Two times a day (BID) | ORAL | 0 refills | Status: DC | PRN

## 2024-04-06 MED ORDER — PROCHLORPERAZINE MALEATE 5 MG PO TABS: 5.0000 mg | ORAL_TABLET | Freq: Four times a day (QID) | ORAL | 0 refills | Status: AC | PRN

## 2024-04-06 MED ORDER — PREDNISONE 10 MG (21) PO TBPK: ORAL_TABLET | Freq: Every day | ORAL | 0 refills | Status: DC

## 2024-04-06 MED ORDER — METOCLOPRAMIDE HCL 5 MG/ML IJ SOLN
10.0000 mg | Freq: Once | INTRAMUSCULAR | Status: AC
  Administered 2024-04-06: 10 mg via INTRAMUSCULAR

## 2024-04-06 NOTE — ED Triage Notes (Signed)
 Pt presents with SOB, nasal congestion nausea, back pain, chills and bodyaches x 2 days. Pt has taken tylenol  and zofran  for her symptoms.

## 2024-04-06 NOTE — ED Provider Notes (Signed)
 [facility]    CSN: [id number] Arrival date & time: 04/06/24  1415      History   Chief Complaint Chief Complaint  Patient presents with   Headache   Nasal Congestion   Generalized Body Aches   Nausea   Shortness of Breath    HPI Sandra Sanders is a 43 y.o. female.   HPI  History obtained from the patient. [name] presents for chills, left upper back pain that gets worse with breathing, myalgias, nausea, chest tightness, shortness of breath, rhinorrhea, abdominal discomfort that started 2-3  days ago.   Denies fever, vomiting, diarrhea, wheezing. Has not eaten anything in 2 days. Has only drank a cup of gingerale in the past 20 hours.  Took Zofran  that didn't help. Has history of migraines and has headaches for the past 2 weeks.  No LMP recorded. Patient has had a hysterectomy.  She is a former smoker who quit over 15 years ago.  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        Past Medical History:  Diagnosis Date   Anemia    Anxiety    Arthritis    Asthma    Cancer (HCC)    Cervical Cancer   COVID-19 09/2019   GERD (gastroesophageal reflux disease)    Headache    PFO (patent foramen ovale)    as an infant   Thyroid disease     Patient Active Problem List   Diagnosis Date Noted   Chalazion of left lower eyelid 02/10/2023   Viral illness 02/10/2023   Otalgia of both ears 02/10/2023   Hx MRSA infection 01/08/2022   Pain in joint of right ankle 07/09/2021   Contusion of right ankle 07/09/2021   Muscle strain of lower extremity 06/27/2021   Migraine 06/27/2021   Non-seasonal allergic rhinitis 01/22/2021   Epigastric pain 12/27/2020   B12 deficiency 09/23/2018   Degenerative disc disease at L5-S1 level 12/30/2017   Gastroesophageal reflux disease 05/26/2017   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   Hematochezia 11/19/2016   Morbid obesity (HCC) 04/22/2016   Iron  deficiency 03/18/2016   Helicobacter pylori gastrointestinal tract infection 01/18/2016   Vitamin D deficiency 01/18/2016   Fatigue 12/18/2015   Malaise 12/18/2015   Snoring 12/18/2015   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   Thyroid disease 02/23/2014   Syncope 12/09/2013   Insomnia 09/19/2013   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   Left knee pain 06/30/2013   ANA positive 03/08/2013   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   Edema 10/16/2011   Anxiety 09/17/2011   Asthma, well controlled 06/25/2011    Past Surgical History:  Procedure Laterality Date   ABDOMINAL HYSTERECTOMY     AUGMENTATION MAMMAPLASTY Bilateral 2000   CHOLECYSTECTOMY     COLONOSCOPY N/A 01/06/2024   Procedure: COLONOSCOPY;  Surgeon: Tye Millet, DO;  Location: [facility];  Service: General;  Laterality: N/A;   KNEE SURGERY Left    LAPAROSCOPIC GASTRIC SLEEVE RESECTION N/A 04/22/2016   Procedure: LAPAROSCOPIC GASTRIC SLEEVE RESECTION;  Surgeon: Thom CHRISTELLA Pin, MD;  Location: [facility] ORS;  Service: General;  Laterality: N/A;   SEPTOPLASTY      OB History   No obstetric history on file.      Home Medications    Prior to Admission medications   Medication Sig Start Date End Date Taking? Authorizing Provider  chlorpheniramine-HYDROcodone (TUSSIONEX) 10-8 MG/5ML Take 5  mLs by mouth every 12 (twelve) hours as needed. 04/06/24  Yes Mikella Linsley, DO  predniSONE  (STERAPRED UNI-PAK 21 TAB) 10 MG (21) TBPK tablet Take by mouth daily. Take 6 tabs by mouth daily for 1, then 5 tabs for 1 day, then 4 tabs for 1 day, then 3 tabs for 1 day, then 2 tabs for 1 day, then 1 tab for 1 day. 04/06/24  Yes Madylin Fairbank, DO  prochlorperazine  (COMPAZINE ) 5 MG tablet Take 1 tablet (5 mg total) by mouth every 6 (six) hours as needed for nausea or vomiting. 04/06/24  Yes Toini Failla, DO  acetaminophen  (TYLENOL ) 500 MG tablet Take 1,000 mg by mouth every 6 (six) hours as needed.     [provider]  albuterol  (VENTOLIN  HFA) 108 (90 Base) MCG/ACT inhaler Inhale 2 puffs into the lungs every 4 (four) hours as needed. 07/15/23   Bernardino Ditch, NP  ALPRAZolam (XANAX) 0.5 MG tablet Take 0.5-1 mg by mouth 3 (three) times daily as needed. 06/03/21   [provider]  amphetamine-dextroamphetamine (ADDERALL) 20 MG tablet Take 20 mg by mouth 4 (four) times daily. 06/14/21   [provider]  aspirin EC 81 MG tablet Take 81 mg by mouth daily. Swallow whole.    [provider]  benzonatate  (TESSALON ) 100 MG capsule Take 2 capsules (200 mg total) by mouth every 8 (eight) hours. 02/13/24   Bernardino Ditch, NP  BREYNA 160-4.5 MCG/ACT inhaler Inhale into the lungs. 10/05/23 10/04/24  [provider]  buPROPion (WELLBUTRIN) 100 MG tablet Take by mouth. 05/22/22   [provider]  clobetasol cream (TEMOVATE) 0.05 % 1(ONE) APPLICATION(S) TOPICAL 2(TWO) TIMES A DAY 10/25/21   [provider]  cyanocobalamin (VITAMIN B12) 1000 MCG/ML injection Inject into the skin. 12/10/22 07/14/24  [provider]  cyclobenzaprine  (FLEXERIL ) 5 MG tablet Take 1 tablet (5 mg total) by mouth 3 (three) times daily as needed for muscle spasms. 05/09/22   White, Shelba SAUNDERS, NP  escitalopram (LEXAPRO) 20 MG tablet Take 20 mg by mouth daily. 04/02/22   [provider]  estradiol (CLIMARA - DOSED IN MG/24 HR) 0.075 mg/24hr patch Place onto the skin. 06/29/23   [provider]  fluticasone (FLONASE) 50 MCG/ACT nasal spray 2 sprays into each nostril daily at 0600. 05/02/21   [provider]  furosemide (LASIX) 20 MG tablet Take 1 tablet by mouth daily. 04/18/22   [provider]  hydroxychloroquine (PLAQUENIL) 200 MG tablet Take by mouth.    [provider]  levofloxacin  (LEVAQUIN ) 500 MG tablet Take 1 tablet (500 mg total) by mouth daily. 02/13/24   Bernardino Ditch, NP  levothyroxine (SYNTHROID) 150 MCG tablet Take by mouth.  04/21/23   [provider]  pantoprazole (PROTONIX) 40 MG tablet Take 40 mg by mouth 2 (two) times daily. 06/21/21   [provider]  potassium chloride  (KLOR-CON  M) 10 MEQ tablet Take by mouth. 07/22/23 07/21/24  [provider]  pregabalin (LYRICA) 75 MG capsule Take 75 mg by mouth 2 (two) times daily. 04/09/22   [provider]  promethazine -dextromethorphan (PROMETHAZINE -DM) 6.25-15 MG/5ML syrup Take 5 mLs by mouth 4 (four) times daily as needed. 02/13/24   Bernardino Ditch, NP  Spacer/Aero-Holding Raguel (AEROCHAMBER MV) inhaler Use as instructed 07/15/23   Bernardino Ditch, NP  SUMAtriptan  (IMITREX ) 50 MG tablet Take 1-2 tablets (50-100 mg total) by mouth once for 1 dose. May repeat in 2 hours if headache persists or recurs. 10/31/20   [provider]  thyroid (ARMOUR) 60 MG tablet Take 60 mg by mouth 2 (two) times daily.    [provider]  tiZANidine (ZANAFLEX) 4 MG tablet Take 1 tablet by mouth every 6 (six) hours. 09/24/23   [provider]  topiramate (TOPAMAX) 25 MG tablet Take 25 mg by mouth 2 (two) times daily.    [provider]  traZODone (DESYREL) 150 MG tablet Take 150 mg by mouth at bedtime.    [provider]  Vilazodone HCl (VIIBRYD) 10 MG TABS Take 10 mg by mouth daily. 08/25/23   [provider]    Family History Family History  Problem Relation Age of Onset   Hypertension Mother    Hypertension Father    Cancer Other     Social History Social History   Tobacco Use   Smoking status: Former    Current packs/day: 0.00    Types: Cigarettes    Quit date: 03/22/2004    Years since quitting: 20.0   Smokeless tobacco: Never  Vaping Use   Vaping status: Never Used  Substance Use Topics   Alcohol use: No   Drug use: No     Allergies   Dextrose , Hydromorphone, Hydromorphone hcl, Other, Hydrocodone, Tramadol, Banana extract allergy skin test, Hydrocodone-acetaminophen , Banana, Citrullus  vulgaris, Daucus carota, Lidocaine , Miconazole, Tioconazole, and Wound dressing adhesive   Review of Systems Review of Systems: negative unless otherwise stated in HPI.      Physical Exam Triage Vital Signs ED Triage Vitals  Encounter Vitals Group     BP 04/06/24 1517 105/70     Girls Systolic BP Percentile --      Girls Diastolic BP Percentile --      Boys Systolic BP Percentile --      Boys Diastolic BP Percentile --      Pulse Rate 04/06/24 1516 75     Resp 04/06/24 1516 16     Temp 04/06/24 1516 98.4 F (36.9 C)     Temp Source 04/06/24 1516 Oral     SpO2 04/06/24 1516 97 %     Weight --      Height --      Head Circumference --      Peak Flow --      Pain Score 04/06/24 1514 8     Pain Loc --      Pain Education --      Exclude from Growth Chart --    No data found.  Updated Vital Signs BP 105/70 (BP Location: Right Arm)   Pulse 75   Temp 98.4 F (36.9 C) (Oral)   Resp 16   SpO2 97%   Visual Acuity Right Eye Distance:   Left Eye Distance:   Bilateral Distance:    Right Eye Near:   Left Eye Near:    Bilateral Near:     Physical Exam GEN:     alert, non-toxic appearing female in no distress    HENT:  mucus membranes moist, oropharyngeal without lesions, mild erythema, no tonsillar hypertrophy or exudates,  clear nasal discharge, bilateral TM normal EYES:   pupils equal and reactive, no scleral injection or discharge NECK:  normal ROM, +lymphadenopathy, no meningismus   RESP:  no increased work of breathing, clear to auscultation bilaterally CVS:   regular rate and rhythm Skin:   warm and dry, no rash on visible skin     UC Treatments / Results  Labs (all labs ordered are listed, but only abnormal results are displayed)  Labs Reviewed  URINALYSIS, W/ REFLEX TO CULTURE (INFECTION SUSPECTED) - Abnormal; Notable for the following components:      Result Value   Ketones, ur TRACE (*)    Bacteria, UA RARE (*)    All other components within normal  limits  RESP PANEL BY RT-PCR (RSV, FLU A&B, COVID)  [id number]    EKG   Radiology DG Chest 2 View Result Date: 04/06/2024 CLINICAL DATA:  Cough and shortness of breath EXAM: CHEST - 2 VIEW COMPARISON:  X-ray 02/13/2024 FINDINGS: Hyperinflation. No consolidation, pneumothorax or effusion. No edema. Normal cardiopericardial silhouette. Surgical clips in the right upper quadrant of the abdomen. IMPRESSION: Hyperinflation.  No acute cardiopulmonary disease. Electronically Signed   By: Ranell Bring M.D.   On: 04/06/2024 16:09     Procedures Procedures (including critical care time)  Medications Ordered in UC Medications  metoCLOPramide  (REGLAN ) injection 10 mg (10 mg Intramuscular Given 04/06/24 1533)    Initial Impression / Assessment and Plan / UC Course  I have reviewed the triage vital signs and the nursing notes.  Pertinent labs & imaging results that were available during my care of the patient were reviewed by me and considered in my medical decision making (see chart for details).       Pt is a 43 y.o. female who presents for 2 days of respiratory with GI symptoms. Mariea is afebrile here without recent antipyretics. Satting well on room air. Overall pt is =non-toxic appearing, well hydrated, without respiratory distress. Pulmonary exam is unremarkable.  RSV, COVID and influenza panel obtained and was negative. Chest xray obtained as pt concerned she may have pneumonia.  Chest xray personally reviewed by me without focal pneumonia, pleural effusion, cardiomegaly or pneumothorax. Radiologist impression reviewed.    Has chronic migraines. Given Reglan  10 mg IM for headache and nausea.    Urinalysis showed trace ketonuria but no significant dehydration seen despite pt's report of decreased hydration.  Additionally, no hematuria or evidence of acute cystitis.  History consistent with viral illness. Discussed symptomatic treatment.  Explained lack of efficacy of antibiotics in viral  disease.  Prednisone  Dosepak prescribed for inflammation.  Compazine  for her nausea.  And Tussionex for her cough.  Typical duration of symptoms discussed.   Return and ED precautions given and voiced understanding. Discussed MDM, treatment plan and plan for follow-up with patient  who agrees with plan.     Final Clinical Impressions(s) / UC Diagnoses   Final diagnoses:  SOB (shortness of breath)  Influenza-like illness  Dysfunction of both eustachian tubes   Discharge Instructions   None    ED Prescriptions     Medication Sig Dispense Auth. Provider   prochlorperazine  (COMPAZINE ) 5 MG tablet Take 1 tablet (5 mg total) by mouth every 6 (six) hours as needed for nausea or vomiting. 30 tablet Aili Casillas, DO   chlorpheniramine-HYDROcodone (TUSSIONEX) 10-8 MG/5ML Take 5 mLs by mouth every 12 (twelve) hours as needed. 115 mL Lathan Gieselman, DO   predniSONE  (STERAPRED UNI-PAK 21 TAB) 10 MG (21) TBPK tablet Take by mouth daily. Take 6 tabs by mouth daily for 1, then 5 tabs for 1 day, then 4 tabs for 1 day, then 3 tabs for 1 day, then 2 tabs for 1 day, then 1 tab for 1 day. 21 tablet Karolina Zamor, DO      I have reviewed the PDMP during this encounter.   Orson Rho, DO 04/13/24 1256

## 2024-05-12 ENCOUNTER — Ambulatory Visit
Admission: EM | Admit: 2024-05-12 | Discharge: 2024-05-12 | Disposition: A | Discharge status: Home / Self Care | Attending: Emergency Medicine | Admitting: Emergency Medicine

## 2024-05-12 DIAGNOSIS — R519 Headache, unspecified: Secondary | ICD-10-CM | POA: Diagnosis not present

## 2024-05-12 MED ORDER — DEXAMETHASONE SODIUM PHOSPHATE 10 MG/ML IJ SOLN: 10.0000 mg | Freq: Once | INTRAMUSCULAR | Status: DC

## 2024-05-12 MED ORDER — DEXAMETHASONE SODIUM PHOSPHATE 10 MG/ML IJ SOLN
10.0000 mg | Freq: Once | INTRAMUSCULAR | Status: AC
  Administered 2024-05-12: 10 mg via INTRAMUSCULAR

## 2024-05-12 MED ORDER — KETOROLAC TROMETHAMINE 30 MG/ML IJ SOLN: 15.0000 mg | Freq: Once | INTRAMUSCULAR | Status: DC

## 2024-05-12 MED ORDER — METOCLOPRAMIDE HCL 5 MG/ML IJ SOLN
10.0000 mg | Freq: Once | INTRAMUSCULAR | Status: AC
  Administered 2024-05-12: 10 mg via INTRAMUSCULAR

## 2024-05-12 MED ORDER — METOCLOPRAMIDE HCL 5 MG/ML IJ SOLN: 10.0000 mg | Freq: Once | INTRAMUSCULAR | Status: AC

## 2024-05-12 MED ORDER — KETOROLAC TROMETHAMINE 60 MG/2ML IM SOLN
15.0000 mg | Freq: Once | INTRAMUSCULAR | Status: AC
  Administered 2024-05-12: 15 mg via INTRAMUSCULAR

## 2024-05-12 MED ORDER — METOCLOPRAMIDE HCL 5 MG/5ML PO SOLN: 10.0000 mg | Freq: Once | ORAL | Status: DC

## 2024-05-12 MED ORDER — SODIUM CHLORIDE 0.9 % IV BOLUS: 250.0000 mL | Freq: Once | INTRAVENOUS | Status: DC

## 2024-05-12 MED ORDER — KETOROLAC TROMETHAMINE 15 MG/ML IJ SOLN: 15.0000 mg | Freq: Once | INTRAMUSCULAR | Status: DC

## 2024-05-12 NOTE — Discharge Instructions (Signed)
 Take home meds as prescribed. Keep headache log of symptoms to take to neurology appointment. If you develop worst headache of your life, new or worsening symptoms, go to the ER for further evaluation

## 2024-05-12 NOTE — ED Triage Notes (Signed)
 Patient states that she a migraine for a week. Patient has called her neurologist and has an appointment 10-1. Taking Excedrin-Imitrex  and tylenol . Patient states that non of the medication is working. Patient has had nausea

## 2024-05-12 NOTE — ED Provider Notes (Signed)
 [facility]    CSN: [id number] Arrival date & time: 05/12/24  0806      History   Chief Complaint Chief Complaint  Patient presents with   Migraine    HPI Sandra Sanders is a 43 y.o. female.   Sandra Sanders, 43 y.o. female presenting for migraine headache since Sunday.  Patient reports that she was seen here in urgent care in past (03/2024) was given multiple injections for migraine headache which helped with symptoms.   Patient has been using Excedrin-Imitrex  and tylenol  without relief. Patient denies any vomiting but states that she is nauseous.  Patient states that headache comes in waves and points to frontal area(temple to temple)and feels throbbing which is typical for her migraines headaches, rates it as 9/10. Pt reports this is not worst HA of life.   PMH: Recurrent headaches, diabetes, thyroid disease, arthritis, anemia, cervical cancer, anxiety, asthma, GERD, previous smoker  Has neurology appt 06/22/2024, has called numerous times to see if she can get worked in.   The history is provided by the patient. No language interpreter was used.    Past Medical History:  Diagnosis Date   Anemia    Anxiety    Arthritis    Asthma    Cancer (HCC)    Cervical Cancer   COVID-19 09/2019   GERD (gastroesophageal reflux disease)    Headache    PFO (patent foramen ovale)    as an infant   Thyroid disease     Patient Active Problem List   Diagnosis Date Noted   Bad headache 05/12/2024   Chalazion of left lower eyelid 02/10/2023   Viral illness 02/10/2023   Otalgia of both ears 02/10/2023   Hx MRSA infection 01/08/2022   Pain in joint of right ankle 07/09/2021   Contusion of right ankle 07/09/2021   Muscle strain of lower extremity 06/27/2021   Migraine 06/27/2021   Non-seasonal allergic rhinitis 01/22/2021   Epigastric pain 12/27/2020   B12 deficiency 09/23/2018   Degenerative disc disease at L5-S1 level 12/30/2017   Gastroesophageal reflux  disease 05/26/2017   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   Hematochezia 11/19/2016   Morbid obesity (HCC) 04/22/2016   Iron deficiency 03/18/2016   Helicobacter pylori gastrointestinal tract infection 01/18/2016   Vitamin D deficiency 01/18/2016   Fatigue 12/18/2015   Malaise 12/18/2015   Snoring 12/18/2015   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   Thyroid disease 02/23/2014   Syncope 12/09/2013   Insomnia 09/19/2013   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   Left knee pain 06/30/2013   ANA positive 03/08/2013   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   Edema 10/16/2011   Anxiety 09/17/2011   Asthma, well controlled 06/25/2011    Past Surgical History:  Procedure Laterality Date   ABDOMINAL HYSTERECTOMY     AUGMENTATION MAMMAPLASTY Bilateral 2000   CHOLECYSTECTOMY     COLONOSCOPY N/A 01/06/2024   Procedure: COLONOSCOPY;  Surgeon: Tye Millet, DO;  Location: [facility];  Service: General;  Laterality: N/A;   KNEE SURGERY Left    LAPAROSCOPIC GASTRIC SLEEVE RESECTION N/A 04/22/2016   Procedure: LAPAROSCOPIC GASTRIC SLEEVE RESECTION;  Surgeon: Thom CHRISTELLA Pin, MD;  Location: [facility] OR[facility];  Service: General;  Laterality: N/A;   SEPTOPLASTY      OB History   No obstetric history on file.      Home Medications    Prior to Admission medications   Medication  Sig Start Date End Date Taking? Authorizing Provider  acetaminophen  (TYLENOL ) 500 MG tablet Take 1,000 mg by mouth every 6 (six) hours as needed.   Yes [provider]  ALPRAZolam SHEFFIELD) 0.5 MG tablet Take 0.5-1 mg by mouth 3 (three) times daily as needed. 06/03/21  Yes [provider]  aspirin EC 81 MG tablet Take 81 mg by mouth daily. Swallow whole.   Yes [provider]  buPROPion (WELLBUTRIN) 100 MG tablet Take by mouth. 05/22/22  Yes [provider]  escitalopram (LEXAPRO) 20 MG tablet Take 20 mg by mouth  daily. 04/02/22  Yes [provider]  estradiol (CLIMARA - DOSED IN MG/24 HR) 0.075 mg/24hr patch Place onto the skin. 06/29/23  Yes [provider]  furosemide (LASIX) 20 MG tablet Take 1 tablet by mouth daily. 04/18/22  Yes [provider]  hydroxychloroquine (PLAQUENIL) 200 MG tablet Take by mouth.   Yes [provider]  levothyroxine (SYNTHROID) 150 MCG tablet Take by mouth. 04/21/23  Yes [provider]  pantoprazole (PROTONIX) 40 MG tablet Take 40 mg by mouth 2 (two) times daily. 06/21/21  Yes [provider]  potassium chloride  (KLOR-CON  M) 10 MEQ tablet Take by mouth. 07/22/23 07/21/24 Yes [provider]  pregabalin (LYRICA) 75 MG capsule Take 75 mg by mouth 2 (two) times daily. 04/09/22  Yes [provider]  prochlorperazine  (COMPAZINE ) 5 MG tablet Take 1 tablet (5 mg total) by mouth every 6 (six) hours as needed for nausea or vomiting. 04/06/24  Yes Brimage, Vondra, DO  SUMAtriptan  (IMITREX ) 50 MG tablet Take 1-2 tablets (50-100 mg total) by mouth once for 1 dose. May repeat in 2 hours if headache persists or recurs. 10/31/20  Yes [provider]  topiramate (TOPAMAX) 25 MG tablet Take 25 mg by mouth 2 (two) times daily.   Yes [provider]  traZODone (DESYREL) 150 MG tablet Take 150 mg by mouth at bedtime.   Yes [provider]  Vilazodone HCl (VIIBRYD) 10 MG TABS Take 10 mg by mouth daily. 08/25/23  Yes [provider]  albuterol  (VENTOLIN  HFA) 108 (90 Base) MCG/ACT inhaler Inhale 2 puffs into the lungs every 4 (four) hours as needed. 07/15/23   Bernardino Ditch, NP  amphetamine-dextroamphetamine (ADDERALL) 20 MG tablet Take 20 mg by mouth 4 (four) times daily. 06/14/21   [provider]  benzonatate  (TESSALON ) 100 MG capsule Take 2 capsules (200 mg total) by mouth every 8 (eight) hours. 02/13/24   Bernardino Ditch, NP  BREYNA 160-4.5 MCG/ACT inhaler Inhale into the lungs. 10/05/23 10/04/24   [provider]  chlorpheniramine-HYDROcodone (TUSSIONEX) 10-8 MG/5ML Take 5 mLs by mouth every 12 (twelve) hours as needed. 04/06/24   Brimage, Vondra, DO  clobetasol cream (TEMOVATE) 0.05 % 1(ONE) APPLICATION(S) TOPICAL 2(TWO) TIMES A DAY 10/25/21   [provider]  cyanocobalamin (VITAMIN B12) 1000 MCG/ML injection Inject into the skin. 12/10/22 07/14/24  [provider]  cyclobenzaprine  (FLEXERIL ) 5 MG tablet Take 1 tablet (5 mg total) by mouth 3 (three) times daily as needed for muscle spasms. 05/09/22   White, Adrienne R, NP  fluticasone (FLONASE) 50 MCG/ACT nasal spray 2 sprays into each nostril daily at 0600. 05/02/21   [provider]  levofloxacin  (LEVAQUIN ) 500 MG tablet Take 1 tablet (500 mg total) by mouth daily. 02/13/24   Bernardino Ditch, NP  predniSONE  (STERAPRED UNI-PAK 21 TAB) 10 MG (21) TBPK tablet Take by mouth daily. Take 6 tabs by mouth daily for 1,  then 5 tabs for 1 day, then 4 tabs for 1 day, then 3 tabs for 1 day, then 2 tabs for 1 day, then 1 tab for 1 day. 04/06/24   Brimage, Vondra, DO  promethazine -dextromethorphan (PROMETHAZINE -DM) 6.25-15 MG/5ML syrup Take 5 mLs by mouth 4 (four) times daily as needed. 02/13/24   Bernardino Ditch, NP  Spacer/Aero-Holding Chambers (AEROCHAMBER MV) inhaler Use as instructed 07/15/23   Bernardino Ditch, NP  thyroid (ARMOUR) 60 MG tablet Take 60 mg by mouth 2 (two) times daily.    [provider]  tiZANidine (ZANAFLEX) 4 MG tablet Take 1 tablet by mouth every 6 (six) hours. 09/24/23   [provider]    Family History Family History  Problem Relation Age of Onset   Hypertension Mother    Hypertension Father    Cancer Other     Social History Social History   Tobacco Use   Smoking status: Former    Current packs/day: 0.00    Types: Cigarettes    Quit date: 03/22/2004    Years since quitting: 20.1   Smokeless tobacco: Never  Vaping Use   Vaping status: Never Used  Substance Use Topics   Alcohol  use: No   Drug use: No     Allergies   Dextrose , Hydromorphone, Hydromorphone hcl, Other, Hydrocodone, Tramadol, Banana extract allergy skin test, Hydrocodone-acetaminophen , Banana, Citrullus vulgaris, Daucus carota, Lidocaine , Miconazole, Tioconazole, and Wound dressing adhesive   Review of Systems Review of Systems  Constitutional:  Negative for fever.  Eyes:  Negative for photophobia and visual disturbance.  Gastrointestinal:  Positive for nausea. Negative for vomiting.  Neurological:  Positive for headaches. Negative for dizziness, tremors, seizures, syncope, facial asymmetry, speech difficulty, weakness, light-headedness and numbness.  All other systems reviewed and are negative.    Physical Exam Triage Vital Signs ED Triage Vitals  Encounter Vitals Group     BP      Girls Systolic BP Percentile      Girls Diastolic BP Percentile      Boys Systolic BP Percentile      Boys Diastolic BP Percentile      Pulse      Resp      Temp      Temp src      SpO2      Weight      Height      Head Circumference      Peak Flow      Pain Score      Pain Loc      Pain Education      Exclude from Growth Chart    No data found.  Updated Vital Signs BP 93/66 (BP Location: Right Arm)   Pulse 71   Temp 97.8 F (36.6 C) (Oral)   Resp 17   Wt 138 lb (62.6 kg)   SpO2 98%   BMI 20.38 kg/m   Visual Acuity Right Eye Distance:   Left Eye Distance:   Bilateral Distance:    Right Eye Near:   Left Eye Near:    Bilateral Near:     Physical Exam Vitals and nursing note reviewed.  Constitutional:      General: She is not in acute distress.    Appearance: She is well-developed and well-groomed.  HENT:     Head: Normocephalic and atraumatic.      Comments: Area of HA marked Eyes:     General: Lids are normal.     Conjunctiva/sclera: Conjunctivae normal.  Pupils: Pupils are equal, round, and reactive to light.  Cardiovascular:     Rate and Rhythm: Normal rate and  regular rhythm.     Heart sounds: Normal heart sounds. No murmur heard. Pulmonary:     Effort: Pulmonary effort is normal. No respiratory distress.     Breath sounds: Normal breath sounds.  Abdominal:     Palpations: Abdomen is soft.     Tenderness: There is no abdominal tenderness.  Musculoskeletal:        General: No swelling.     Cervical back: Neck supple.  Skin:    General: Skin is warm and dry.     Capillary Refill: Capillary refill takes less than 2 seconds.  Neurological:     General: No focal deficit present.     Mental Status: She is alert and oriented to person, place, and time.     GCS: GCS eye subscore is 4. GCS verbal subscore is 5. GCS motor subscore is 6.  Psychiatric:        Attention and Perception: Attention normal.        Mood and Affect: Mood normal.        Speech: Speech normal.        Behavior: Behavior normal. Behavior is cooperative.      UC Treatments / Results  Labs (all labs ordered are listed, but only abnormal results are displayed) Labs Reviewed - No data to display  EKG   Radiology No results found.  Procedures Procedures (including critical care time)  Medications Ordered in UC Medications  metoCLOPramide  (REGLAN ) injection 10 mg (0 mg Intravenous Duplicate 05/12/24 0909)  dexamethasone  (DECADRON ) injection 10 mg (10 mg Intramuscular Given 05/12/24 0916)  metoCLOPramide  (REGLAN ) injection 10 mg (10 mg Intramuscular Given 05/12/24 0919)  ketorolac  (TORADOL ) injection 15 mg (15 mg Intramuscular Given 05/12/24 0919)    Initial Impression / Assessment and Plan / UC Course  I have reviewed the triage vital signs and the nursing notes.  Pertinent labs & imaging results that were available during my care of the patient were reviewed by me and considered in my medical decision making (see chart for details).  Clinical Course as of 05/12/24 1338  Thu May 12, 2024  0855 Pt requesting IV meds previously given for MHA: Decadron  10 mg, Toradol   15 mg, Reglan  10 mg, NS 250cc bolus.  [JD]  0907 Per charge nurse pt does not want IV, wants shots only. [JD]    Clinical Course User Index [JD] Kyndel Egger, Rilla, NP  Discussed exam findings and plan of care with patient, recommend HA log, keep neurology appt, strict go to ER precautions given.   Patient verbalized understanding to this provider.  Ddx: Recurrent Headaches(tension HA vs. cluster HA vs Migraine HA, sinus congestion, allergies, sleep apnea, anxiety Final Clinical Impressions(s) / UC Diagnoses   Final diagnoses:  Bad headache     Discharge Instructions      Take home meds as prescribed. Keep headache log of symptoms to take to neurology appointment. If you develop worst headache of your life, new or worsening symptoms, go to the ER for further evaluation      ED Prescriptions   None    PDMP not reviewed this encounter.   Aminta Rilla, NP 05/12/24 1338

## 2024-08-08 ENCOUNTER — Ambulatory Visit
Admission: RE | Admit: 2024-08-08 | Discharge: 2024-08-08 | Disposition: A | Discharge status: Home / Self Care | Source: Ambulatory Visit | Attending: Emergency Medicine | Admitting: Emergency Medicine

## 2024-08-08 VITALS — BP 91/60 | HR 58 | Temp 97.9°F | Resp 16 | Wt 143.0 lb

## 2024-08-08 DIAGNOSIS — J0140 Acute pansinusitis, unspecified: Secondary | ICD-10-CM | POA: Diagnosis not present

## 2024-08-08 DIAGNOSIS — J22 Unspecified acute lower respiratory infection: Secondary | ICD-10-CM

## 2024-08-08 DIAGNOSIS — J45901 Unspecified asthma with (acute) exacerbation: Secondary | ICD-10-CM | POA: Diagnosis not present

## 2024-08-08 MED ORDER — PREDNISONE 20 MG PO TABS: 40.0000 mg | ORAL_TABLET | Freq: Every day | ORAL | 0 refills | Status: AC

## 2024-08-08 MED ORDER — FLUTICASONE PROPIONATE 50 MCG/ACT NA SUSP: 2.0000 | Freq: Every day | NASAL | 0 refills | Status: AC

## 2024-08-08 MED ORDER — HYDROCOD POLI-CHLORPHE POLI ER 10-8 MG/5ML PO SUER: 5.0000 mL | Freq: Two times a day (BID) | ORAL | 0 refills | Status: AC | PRN

## 2024-08-08 MED ORDER — AMOXICILLIN-POT CLAVULANATE 875-125 MG PO TABS: 1.0000 | ORAL_TABLET | Freq: Two times a day (BID) | ORAL | 0 refills | Status: AC

## 2024-08-08 MED ORDER — AZITHROMYCIN 250 MG PO TABS: 250.0000 mg | ORAL_TABLET | Freq: Every day | ORAL | 0 refills | Status: AC

## 2024-08-08 NOTE — ED Triage Notes (Signed)
 Coughing for 2 weeks, can't get rid of. Taken mucinex , NyQuil, DayQuil, Sudafed, having trouble breathing, overall body aches - Entered by patient

## 2024-08-08 NOTE — ED Provider Notes (Incomplete)
 HPI  SUBJECTIVE:  Sandra Sanders is a 43 y.o. female who presents with 2 weeks of nasal congestion that has now gone into her chest.  She reports barking cough productive of green thick sputum, wheezing, sinus pain and pressure, postnasal drip, upper dental pain, shortness of breath present with coughing only.  She is unable to sleep at night because of the cough.  She reports double sickening.  No fevers, nasal congestion, chest pain, dyspnea on exertion, facial swelling.  No new packs in the past 3 months.  She took DayQuil within the past 6 hours.  She has been using her spacer and albuterol  inhaler with improvement in her symptoms.  She has also been taking NyQuil, Mucinex  and DayQuil.  Symptoms are worse in the morning and with exposure to cold air.  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  LMP: Status post hysterectomy.  [facility]  Past Medical History:  Diagnosis Date   Anemia    Anxiety    Arthritis    Asthma    Cancer (HCC)    Cervical Cancer   COVID-19 09/2019   GERD (gastroesophageal reflux disease)    Headache    PFO (patent foramen ovale)    as an infant   Thyroid disease     Past Surgical History:  Procedure Laterality Date   ABDOMINAL HYSTERECTOMY     AUGMENTATION MAMMAPLASTY Bilateral 2000   CHOLECYSTECTOMY     COLONOSCOPY N/A 01/06/2024   Procedure: COLONOSCOPY;  Surgeon: Tye Millet, DO;  Location: [facility];  Service: General;  Laterality: N/A;   KNEE SURGERY Left    LAPAROSCOPIC GASTRIC SLEEVE RESECTION N/A 04/22/2016   Procedure: LAPAROSCOPIC GASTRIC SLEEVE RESECTION;  Surgeon: Thom CHRISTELLA Pin, MD;  Location: [facility];  Service: General;  Laterality: N/A;   SEPTOPLASTY      Family History  Problem Relation Age of Onset   Hypertension Mother    Hypertension Father    Cancer Other     Social History   Tobacco Use   Smoking status: Former    Current packs/day:  0.00    Types: Cigarettes    Quit date: 03/22/2004    Years since quitting: 20.3   Smokeless tobacco: Never  Vaping Use   Vaping status: Never Used  Substance Use Topics   Alcohol use: No   Drug use: No    No current facility-administered medications for this encounter.  Current Outpatient Medications:    amoxicillin -clavulanate (AUGMENTIN ) 875-125 MG tablet, Take 1 tablet by mouth every 12 (twelve) hours., Disp: 14 tablet, Rfl: 0   azithromycin  (ZITHROMAX ) 250 MG tablet, Take 1 tablet (250 mg total) by mouth daily. 2 tabs po on day 1, 1 tab po on days 2-5, Disp: 6 tablet, Rfl: 0   chlorpheniramine-HYDROcodone (TUSSIONEX) 10-8 MG/5ML, Take 5 mLs by mouth every 12 (twelve) hours as needed for cough., Disp: 60 mL, Rfl: 0   fluticasone (FLONASE) 50 MCG/ACT nasal spray, Place 2 sprays into both nostrils daily., Disp: 16 g, Rfl: 0   fluticasone (FLONASE) 50 MCG/ACT nasal spray, Place 2 sprays into both nostrils daily., Disp: 16 g, Rfl: 0   predniSONE  (DELTASONE ) 20 MG tablet, Take 2 tablets (40 mg total) by mouth daily with breakfast for 5 days., Disp: 10 tablet, Rfl: 0   acetaminophen  (TYLENOL ) 500 MG tablet, Take 1,000 mg by mouth every 6 (six) hours as needed., Disp: , Rfl:  albuterol  (VENTOLIN  HFA) 108 (90 Base) MCG/ACT inhaler, Inhale 2 puffs into the lungs every 4 (four) hours as needed., Disp: 18 g, Rfl: 0   [Paused] ALPRAZolam (XANAX) 0.5 MG tablet, Take 0.5-1 mg by mouth 3 (three) times daily as needed., Disp: , Rfl:    amphetamine-dextroamphetamine (ADDERALL) 20 MG tablet, Take 20 mg by mouth 4 (four) times daily., Disp: , Rfl:    aspirin EC 81 MG tablet, Take 81 mg by mouth daily. Swallow whole., Disp: , Rfl:    BREYNA 160-4.5 MCG/ACT inhaler, Inhale into the lungs., Disp: , Rfl:    buPROPion (WELLBUTRIN) 100 MG tablet, Take by mouth., Disp: , Rfl:    clobetasol cream (TEMOVATE) 0.05 %, 1(ONE) APPLICATION(S) TOPICAL 2(TWO) TIMES A DAY, Disp: , Rfl:    cyclobenzaprine  (FLEXERIL ) 5  MG tablet, Take 1 tablet (5 mg total) by mouth 3 (three) times daily as needed for muscle spasms., Disp: 30 tablet, Rfl: 0   escitalopram (LEXAPRO) 20 MG tablet, Take 20 mg by mouth daily., Disp: , Rfl:    estradiol (CLIMARA - DOSED IN MG/24 HR) 0.075 mg/24hr patch, Place onto the skin., Disp: , Rfl:    furosemide (LASIX) 20 MG tablet, Take 1 tablet by mouth daily., Disp: , Rfl:    hydroxychloroquine (PLAQUENIL) 200 MG tablet, Take by mouth., Disp: , Rfl:    levothyroxine (SYNTHROID) 150 MCG tablet, Take by mouth., Disp: , Rfl:    pantoprazole (PROTONIX) 40 MG tablet, Take 40 mg by mouth 2 (two) times daily., Disp: , Rfl:    pregabalin (LYRICA) 75 MG capsule, Take 75 mg by mouth 2 (two) times daily., Disp: , Rfl:    prochlorperazine  (COMPAZINE ) 5 MG tablet, Take 1 tablet (5 mg total) by mouth every 6 (six) hours as needed for nausea or vomiting., Disp: 30 tablet, Rfl: 0   Spacer/Aero-Holding Chambers (AEROCHAMBER MV) inhaler, Use as instructed, Disp: 1 each, Rfl: 2   SUMAtriptan  (IMITREX ) 50 MG tablet, Take 1-2 tablets (50-100 mg total) by mouth once for 1 dose. May repeat in 2 hours if headache persists or recurs., Disp: , Rfl:    thyroid (ARMOUR) 60 MG tablet, Take 60 mg by mouth 2 (two) times daily., Disp: , Rfl:    [Paused] tiZANidine (ZANAFLEX) 4 MG tablet, Take 1 tablet by mouth every 6 (six) hours., Disp: , Rfl:    topiramate (TOPAMAX) 25 MG tablet, Take 25 mg by mouth 2 (two) times daily., Disp: , Rfl:    traZODone (DESYREL) 150 MG tablet, Take 150 mg by mouth at bedtime., Disp: , Rfl:    Vilazodone HCl (VIIBRYD) 10 MG TABS, Take 10 mg by mouth daily., Disp: , Rfl:   Allergies  Allergen Reactions   Dextrose  Other (See Comments)    Added for 72 hour fast 11/19/23-11/22/23 > please delete after study   Hydromorphone Hives and Itching    Noted post op after receiving intra-op Noted post op after receiving intra-op  Noted post op after receiving intra-op   Noted post op after receiving  intra-op    Hydromorphone Hcl Hives and Itching   Other Anaphylaxis    Carrots, bananas, and watermelon. per pt Other reaction(s): Other (See Comments) Uncoded Allergy. Allergen: Strawberries, Other Reaction: NOT ASSESSED   Hydrocodone Itching   Tramadol Itching   Banana Extract Allergy Skin Test Itching    banana extract   Hydrocodone-Acetaminophen  Hives    Really bad itching all over   Banana Itching   Citrullus Vulgaris Itching  Daucus Carota Itching   Lidocaine      PT REPORTS   DID NOT WORK     Miconazole Other (See Comments)    SKIN BLISTERS   Tioconazole Rash   Wound Dressing Adhesive Dermatitis     ROS  As noted in HPI.   Physical Exam  BP 91/60 (BP Location: Left Arm)   Pulse (!) 58   Temp 97.9 F (36.6 C) (Oral)   Resp 16   Wt 64.9 kg   SpO2 99%   BMI 21.12 kg/m  BP Readings from Last 3 Encounters:  08/08/24 91/60  05/12/24 93/66  04/06/24 105/70    Constitutional: Well developed, well nourished, no acute distress Eyes: PERRL, EOMI, conjunctiva normal bilaterally HENT: Normocephalic, atraumatic,mucus membranes moist.  Exquisite maxillary, frontal sinus tenderness.  Purulent nasal congestion erythematous, swollen turbinates.  No postnasal drip. Respiratory: Clear to auscultation bilaterally, no rales, no wheezing, no rhonchi.  Fair air movement.  Positive anterior, lateral chest wall tenderness Cardiovascular: Normal rate and rhythm, no murmurs, no gallops, no rubs GI: nondistended skin: No rash, skin intact Musculoskeletal: no deformities Neurologic: Alert & oriented x 3, CN III-XII grossly intact, no motor deficits, sensation grossly intact Psychiatric: Speech and behavior appropriate   ED Course   Medications - No data to display  Orders Placed This Encounter  Procedures   Recheck vitals    Standing Status:   Standing    Number of Occurrences:   1   No results found for this or any previous visit (from the past 24 hours). No results  found.  ED Clinical Impression  1. Acute non-recurrent pansinusitis   2. Persistent asthma with acute exacerbation, unspecified asthma severity   3. Lower respiratory tract infection      ED Assessment/Plan  Patient presents with acute flare of a chronic problem.  [place] Narcotic database reviewed for this patient, and feel that the risk/benefit ratio today is favorable for proceeding with a prescription for controlled substance.  Last opiate prescription was for Tussidex in July 2025  Presentation consistent with acute pansinusitis.  I am also concerned for lower respiratory tract infection versus pneumonia.  Her asthma is also flared.  Discussed with patient that I would like to get a chest x-ray to evaluate for pneumonia, but since it would not change management, patient declined.  Discussed with her that we usually get them as a baseline to make sure that it has resolved in 4 to 6 weeks, but again, she declined.  Home with regular scheduled albuterol  inhaler with a spacer for 4 days, then as needed, prednisone  40 mg for 5 days for asthma exacerbation.  May back off if she improves sooner.  States she does not need prescription for albuterol  or spacer Augmentin  and azithromycin  for sinusitis and possible pneumonia.  Mucinex , Flonase, saline nasal irrigation, Mucinex .  Tussionex for cough.  She has tolerated this before without any problems.  Work note.  Discussed with patient to hold Xanax while taking Tussionex  Discussed labs,, MDM, treatment plan, and plan for follow-up with patient Discussed sn/sx that should prompt return to the ED. patient agrees with plan.   Meds ordered this encounter  Medications   amoxicillin -clavulanate (AUGMENTIN ) 875-125 MG tablet    Sig: Take 1 tablet by mouth every 12 (twelve) hours.    Dispense:  14 tablet    Refill:  0   fluticasone (FLONASE) 50 MCG/ACT nasal spray    Sig: Place 2 sprays into both nostrils daily.  Dispense:  16 g    Refill:  0    azithromycin  (ZITHROMAX ) 250 MG tablet    Sig: Take 1 tablet (250 mg total) by mouth daily. 2 tabs po on day 1, 1 tab po on days 2-5    Dispense:  6 tablet    Refill:  0   fluticasone (FLONASE) 50 MCG/ACT nasal spray    Sig: Place 2 sprays into both nostrils daily.    Dispense:  16 g    Refill:  0   predniSONE  (DELTASONE ) 20 MG tablet    Sig: Take 2 tablets (40 mg total) by mouth daily with breakfast for 5 days.    Dispense:  10 tablet    Refill:  0   chlorpheniramine-HYDROcodone (TUSSIONEX) 10-8 MG/5ML    Sig: Take 5 mLs by mouth every 12 (twelve) hours as needed for cough.    Dispense:  60 mL    Refill:  0      *This clinic note was created using Scientist, clinical (histocompatibility and immunogenetics). Therefore, there may be occasional mistakes despite careful proofreading. ?    Van Knee, MD 08/09/24 [id number]    Van Knee, MD 08/09/24 [phone]

## 2024-08-08 NOTE — Discharge Instructions (Signed)
 Take two puffs from your albuterol  inhaler with your spacer every 4 hours for 2 days, then every 6 hours for 2 days, then as needed. You can back off if you start to improve  sooner. Finish the steroids unless your doctor tells you to stop. Finish the antibiotics, even if you feel better.  Take tylenol  1 gram combined with  600 mg of motrin  up to 3-4 times a day as needed for pain. Make sure you drink extra fluids. Return to the ER if you get worse, have a fever >100.4, or any other concerns.   If the spacer is too expensive at the pharmacy, you can get an AeroChamber Z-Stat off of [employer] for about $10-$15.  Start Mucinx to keep the mucous thin and to decongest you.  Use a NeilMed sinus rinse with distilled water as often as you want to to reduce nasal congestion. Follow the directions on the box.  Flonase.  Tussionex for cough.  Do not take Xanax or Zanaflex while taking Tussionex  Go to www.goodrx.com to look up your medications. This will give you a list of where you can find your prescriptions at the most affordable prices. Or you can ask the pharmacist what the cash price is. This is frequently cheaper than going through insurance.    Go to www.goodrx.com  or www.costplusdrugs.com to look up your medications. This will give you a list of where you can find your prescriptions at the most affordable prices. Or ask the pharmacist what the cash price is, or if they have any other discount programs available to help make your medication more affordable. This can be less expensive than what you would pay with insurance.
